# Patient Record
Sex: Female | Born: 1945 | Race: White | Hispanic: No | Marital: Married
Health system: Midwestern US, Academic
[De-identification: ages and names within clinical notes are randomized; demographics above are authoritative.]

---

## 2017-04-23 MED ORDER — FEXOFENADINE 180 MG PO TAB
180 mg | ORAL_TABLET | Freq: Every day | ORAL | 3 refills | 30.00000 days | Status: AC
Start: 2017-04-23 — End: ?

## 2017-04-23 MED ORDER — FLUTICASONE 50 MCG/ACTUATION NA SPSN
2 | Freq: Every day | NASAL | 3 refills | 60.00000 days | Status: AC
Start: 2017-04-23 — End: ?

## 2017-04-23 MED ORDER — SULFAMETHOXAZOLE-TRIMETHOPRIM 800-160 MG PO TAB
1 | ORAL_TABLET | Freq: Two times a day (BID) | ORAL | 0 refills | Status: CN
Start: 2017-04-23 — End: ?

## 2017-04-23 MED ORDER — LEVOFLOXACIN 250 MG PO TAB
250 mg | ORAL_TABLET | Freq: Every day | ORAL | 0 refills | Status: CN
Start: 2017-04-23 — End: ?

## 2017-04-23 MED ORDER — PREDNISONE 20 MG PO TAB
40 mg | ORAL_TABLET | Freq: Every day | ORAL | 0 refills | Status: CN
Start: 2017-04-23 — End: ?

## 2017-06-22 ENCOUNTER — Encounter: Admit: 2017-06-22 | Discharge: 2017-06-22 | Payer: BC Managed Care – PPO

## 2017-06-22 ENCOUNTER — Ambulatory Visit: Admit: 2017-06-22 | Discharge: 2017-06-22 | Payer: BC Managed Care – PPO

## 2017-06-22 DIAGNOSIS — J324 Chronic pansinusitis: Principal | ICD-10-CM

## 2017-06-22 DIAGNOSIS — K219 Gastro-esophageal reflux disease without esophagitis: ICD-10-CM

## 2017-06-22 DIAGNOSIS — J310 Chronic rhinitis: ICD-10-CM

## 2017-06-22 DIAGNOSIS — M545 Low back pain: Principal | ICD-10-CM

## 2017-06-22 DIAGNOSIS — R51 Headache: ICD-10-CM

## 2017-06-22 DIAGNOSIS — K529 Noninfective gastroenteritis and colitis, unspecified: ICD-10-CM

## 2017-06-22 DIAGNOSIS — M25552 Pain in left hip: ICD-10-CM

## 2017-06-22 DIAGNOSIS — M5126 Other intervertebral disc displacement, lumbar region: ICD-10-CM

## 2017-06-22 DIAGNOSIS — M1990 Unspecified osteoarthritis, unspecified site: ICD-10-CM

## 2017-06-22 DIAGNOSIS — T148XXA Other injury of unspecified body region, initial encounter: ICD-10-CM

## 2017-06-22 DIAGNOSIS — M7918 Myalgia, other site: ICD-10-CM

## 2017-06-22 NOTE — Progress Notes
chief complaint           HPI:  Tina Landry is a 71 y.o. female who returns to clinic for follow up for CRS.  She has a h/o MRSA and Pseudomonas sinusitis.      She is feeling well since the last visit. She did the nasal saline irrigations for 2 weeks after her last visit, stopped for a while, began getting symptoms again and restarted. Her symptoms were fluid buildup and discharge. Does the levaquin irrigation from time to time    The following portions of the patient's history were reviewed and updated as appropriate: allergies, current medications, past family history, past medical history, past social history, past surgical history and problem list.    ROS above reviewed      Current Outpatient Prescriptions:   ???  Aspirin 81 mg Tab, Take 81 mg by mouth every morning.  , Disp: , Rfl:   ???  fexofenadine(+) (ALLEGRA) 180 mg tablet, Take 1 tablet by mouth daily., Disp: 90 tablet, Rfl: 3  ???  fluticasone (FLONASE) 50 mcg/actuation nasal spray, Apply 2 sprays to each nostril as directed daily. Shake bottle gently before using., Disp: 48 g, Rfl: 3  ???  KRILL OIL PO, Take  by mouth., Disp: , Rfl:   ???  MULTIVITAMIN PO, Take 1 Tab by mouth daily., Disp: , Rfl:   ???  PROPRANOLOL HCL (INDERAL PO), Take 60 mg by mouth every morning., Disp: , Rfl:     General    Vitals:    06/22/17 1044   BP: 136/84   Pulse: 54     General:  Well-developed, well-nourished  Communication and Voice:  Clear pitch and clarity, age appropriate    Head and Face  Inspection:  Normocephalic and atraumatic without masses or lesions  Palpation:  Facial skeleton intact without bony stepoffs, no sinus tenderness  Salivary Glands:  No masses or tenderness  Facial Strength:  Facial motility symmetric and full bilaterally             Left -  House-Brackman Grade 1/6             Right - House-Brackman Grade 1/6    ENT  External nose:  No scar or anatomic deformity  Internal Nose:  Septum intact and midline.  No edema, polyps, or rhinorrhea Lips, Teeth, and gums:  Mucosa and teeth intact and viable  TMJ:  No pain to palpation with full mobility  Oral cavity/oropharynx:  No erythema or exudate with non-obstructive tonsils  Nasopharynx:  No masses or lesions with intact mucosa  Hypopharynx:  Intact mucosa without pooling of secretions  Larynx:  Full true vocal cord mobility without lesions or masses    Neck  Neck and Trachea:  Midline trachea without mass or lesion  Thyroid:  No mass or nodularity  Lymphatics:  No lymphadenopathy    Respiratory  Respiratory effort:  Equal inspiration & expiration without use of accessory muscles. No stridor    Cardiovascular  Peripheral Vascular:  Warm extremities with equal pulses    Eyes  Nystagmus: None  EOM: Equal extraocular motion bilaterally    Neuro/Psych/Balance  Orientation: Patient oriented to person, place, and time  Affect: Appropriate mood and affect  Gait: Intact with no imbalance  Cranial nerves: II-XII are intact    Ear  External canal: Left - Canal is patent with intact skin  Right - Canal is patent with intact skin  Tympanic Membranes: Left - Clear and mobile                                            Right - Clear and mobile  Middle Ears: Left - Aerated, no effusion, no masses                          Right - Aerated, no effusion, no masses      Office Procedure    Nasal Endoscopy    Indications: Was performed due to chronic rhinosinusitis    After topical anesthesia and decongestion had been obtained using aerosolized 1% lidocaine and oxymetazoline, a 30 degree rigid endoscope was placed into both nares with the patient in a sitting position. The following was observed:    Right Nasal Cavity and Paranasal Sinuses: Sinuses clear and patent.     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            Impression/Plan: Thank you for allowing me to see your patient in consultation.  Based on my exam and review of the patients records, the following is my impression and plan:    1. Chronic rhinosinusitis    2. Chronic rhinitis    She is improving well. She should continue to irrigate with her levaquin for another week then switch to just saline irrigations with baby shampoo.  We talked about expectations and she understands she will get the biofilm crusts from time to time.      F/u: 6 weeks    Heloise Purpura, M.D.    Professor and Villa Herb, M.D. Chairman  Department of Otolaryngology-Head and Neck Surgery  [place] [facility]       In the presence of Neldon Newport, MD,  I have taken down these notes, Cormac Prosser, Scribe. 06/22/2017 10:58 AM

## 2017-06-22 NOTE — Progress Notes
Date of Service: 06/22/2017    Subjective:             Tina Landry is a 71 y.o. female.    History of Present Illness       Review of Systems   Constitutional: Negative.    HENT: Positive for congestion, postnasal drip and rhinorrhea.    Eyes: Negative.    Respiratory: Negative.    Cardiovascular: Negative.    Gastrointestinal: Negative.    Endocrine: Negative.    Genitourinary: Negative.    Musculoskeletal: Negative.    Skin: Negative.    Allergic/Immunologic: Negative.    Neurological: Negative.    Hematological: Negative.    Psychiatric/Behavioral: Negative.          Objective:          Aspirin 81 mg Tab Take 81 mg by mouth every morning.      fexofenadine(+) (ALLEGRA) 180 mg tablet Take 1 tablet by mouth daily.    fluticasone (FLONASE) 50 mcg/actuation nasal spray Apply 2 sprays to each nostril as directed daily. Shake bottle gently before using.    KRILL OIL PO Take  by mouth.    MULTIVITAMIN PO Take 1 Tab by mouth daily.    PROPRANOLOL HCL (INDERAL PO) Take 60 mg by mouth every morning.     Vitals:    06/22/17 1044   BP: 136/84   Pulse: 54   Weight: 68 kg (150 lb)   Height: 167.6 cm (66")     Body mass index is 24.21 kg/m.     Physical Exam         Assessment and Plan:

## 2017-07-27 ENCOUNTER — Ambulatory Visit: Admit: 2017-07-27 | Discharge: 2017-07-27 | Payer: BC Managed Care – PPO

## 2017-07-27 DIAGNOSIS — J310 Chronic rhinitis: ICD-10-CM

## 2017-07-27 DIAGNOSIS — J324 Chronic pansinusitis: Principal | ICD-10-CM

## 2017-07-27 NOTE — Progress Notes
Date of Service: 07/27/2017    Subjective:             Tina Landry is a 71 y.o. female.    History of Present Illness  Tina Landry is a 71 y.o. female who returns to clinic for follow up for CRS.  She has a h/o MRSA and Pseudomonas sinusitis.      Overall she is doing ok, however every few days, she does have discolored drainage. Doing nasal saline rinses daily along with baby shampoo, completed the levaquin rinses 1 week after last being seen. After completing the levaquin, her drainage returned within a week.  No recent fevers or headaches.  ???  Still denies any sense of smell, has not returned, this is her baseline.     The following portions of the patient's history were reviewed and updated as appropriate: allergies, current medications, past family history, past medical history, past social history, past surgical history and problem list.  ???     Review of Systems   Constitutional: Negative.    HENT: Positive for rhinorrhea.    Eyes: Negative.    Respiratory: Negative.    Cardiovascular: Negative.    Gastrointestinal: Negative.    Endocrine: Negative.    Genitourinary: Negative.    Musculoskeletal: Negative.    Skin: Negative.    Allergic/Immunologic: Negative.    Neurological: Negative.    Hematological: Negative.    Psychiatric/Behavioral: Negative.          Objective:         ??? Aspirin 81 mg Tab Take 81 mg by mouth every morning.     ??? fexofenadine(+) (ALLEGRA) 180 mg tablet Take 1 tablet by mouth daily.   ??? fluticasone (FLONASE) 50 mcg/actuation nasal spray Apply 2 sprays to each nostril as directed daily. Shake bottle gently before using.   ??? KRILL OIL PO Take  by mouth.   ??? MULTIVITAMIN PO Take 1 Tab by mouth daily.   ??? PROPRANOLOL HCL (INDERAL PO) Take 60 mg by mouth every morning.     Vitals:    07/27/17 1032   BP: 143/84   Pulse: 51   Weight: 67.1 kg (148 lb)   Height: 167.6 cm (66)     Body mass index is 23.89 kg/m???.     Physical Exam  General:  Well-developed, well-nourished Communication and Voice:  Clear pitch and clarity, age appropriate  ???  Head and Face  Inspection:  Normocephalic and atraumatic without masses or lesions  Palpation:  Facial skeleton intact without bony stepoffs, no sinus tenderness  Salivary Glands:  No masses or tenderness  Facial Strength:  Facial motility symmetric and full bilaterally             Left -       House-Brackman Grade 1/6             Right -     House-Brackman Grade 1/6  ???  ENT  External nose:  No scar or anatomic deformity  Internal Nose:  Septum intact and midline.  No edema, polyps, or rhinorrhea  Lips, Teeth, and gums:  Mucosa and teeth intact and viable  TMJ:  No pain to palpation with full mobility  Oral cavity/oropharynx:  No erythema or exudate with non-obstructive tonsils  Nasopharynx:  No masses or lesions with intact mucosa  Hypopharynx:  Intact mucosa without pooling of secretions  Larynx:  Full true vocal cord mobility without lesions or masses  ???  Neck  Neck and Trachea:  Midline trachea without mass or lesion  Thyroid:  No mass or nodularity  Lymphatics:  No lymphadenopathy  ???  Respiratory  Respiratory effort:  Equal inspiration & expiration without use of accessory muscles. No stridor  ???  Cardiovascular  Peripheral Vascular:  Warm extremities with equal pulses  ???  Eyes  Nystagmus: None  EOM: Equal extraocular motion bilaterally  ???  Neuro/Psych/Balance  Orientation: Patient oriented to person, place, and time  Affect: Appropriate mood and affect  Gait: Intact with no imbalance  Cranial nerves: II-XII are intact  ???  Ear  External canal:   Left - Canal is patent with intact skin                             Right - Canal is patent with intact skin  Tympanic Membranes:   Left - Clear and mobile                                            Right - Clear and mobile  Middle Ears:      Left - Aerated, no effusion, no masses                          Right - Aerated, no effusion, no masses  ???  ???  Office Procedure  ???  Nasal Endoscopy  ??? Indications: Was performed due to chronic rhinosinusitis  ???  After topical anesthesia and decongestion had been obtained using aerosolized 1% lidocaine and oxymetazoline, a 30 degree rigid endoscope was placed into both nares with the patient in a sitting position. The following was observed:  ???  Right Nasal Cavity and Paranasal Sinuses: Sinuses clear and patent. Green crusting debrided from the ethmoid cavity.   ???  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  Septum: midline    Other:  ???  The patient tolerated the procedure well.       Assessment and Plan:  Thank you for allowing me to see your patient in consultation.  Based on my exam and review of the patients records, the following is my impression and plan:  ???  1. Chronic rhinosinusitis  ???  2. Chronic rhinitis  ???  She is at baseline today. She should continue to irrigate with her saline irrigations with baby shampoo.  We talked about expectations and she understands she will get the biofilm crusts from time to time. During worse symptomatic periods, she should start back up on her levaquin rinses for 1 week until symptoms improve and then back to saline rinses.   ???  ???  F/u: 6 weeks  ???

## 2017-09-13 ENCOUNTER — Encounter: Admit: 2017-09-13 | Discharge: 2017-09-13 | Payer: BC Managed Care – PPO

## 2017-09-13 NOTE — Telephone Encounter
Patient calling back again to f/u on her last call.

## 2017-09-13 NOTE — Telephone Encounter
Patient has a sinus infection and would like to speak to the nurse.

## 2017-09-13 NOTE — Telephone Encounter
lvm stating that I would address this with Dr Wyline Copas tomorrow

## 2017-09-14 MED ORDER — LEVOFLOXACIN 500 MG PO TAB
500 mg | ORAL_TABLET | Freq: Every day | ORAL | 0 refills | 7.00000 days | Status: AC
Start: 2017-09-14 — End: ?

## 2017-09-14 NOTE — Telephone Encounter
Patient had already been rinsing for one week with the levaquin. Per Dr Wyline Copas, levaquin oral abx. Advised patient to let us know if she is not seeing any improvement

## 2017-09-24 ENCOUNTER — Encounter: Admit: 2017-09-24 | Discharge: 2017-09-24 | Payer: BC Managed Care – PPO

## 2017-09-24 NOTE — Telephone Encounter
Patient calling to f/u on a prescription refill of the compounded medicine.

## 2017-09-24 NOTE — Telephone Encounter
Refill approved by Dr Wyline Copas and patient notified.

## 2017-10-14 ENCOUNTER — Encounter: Admit: 2017-10-14 | Discharge: 2017-10-14 | Payer: BC Managed Care – PPO

## 2017-10-14 NOTE — Telephone Encounter
Patient called to speak to the nurse regarding her increased sinus drainage and fever on and off.   She is still using the Levaquin.

## 2017-10-15 ENCOUNTER — Ambulatory Visit: Admit: 2017-10-15 | Discharge: 2017-10-15 | Payer: BC Managed Care – PPO

## 2017-10-15 ENCOUNTER — Encounter: Admit: 2017-10-15 | Discharge: 2017-10-15 | Payer: BC Managed Care – PPO

## 2017-10-15 DIAGNOSIS — R51 Headache: ICD-10-CM

## 2017-10-15 DIAGNOSIS — K529 Noninfective gastroenteritis and colitis, unspecified: ICD-10-CM

## 2017-10-15 DIAGNOSIS — M25552 Pain in left hip: ICD-10-CM

## 2017-10-15 DIAGNOSIS — J310 Chronic rhinitis: ICD-10-CM

## 2017-10-15 DIAGNOSIS — M7918 Myalgia, other site: ICD-10-CM

## 2017-10-15 DIAGNOSIS — M5126 Other intervertebral disc displacement, lumbar region: ICD-10-CM

## 2017-10-15 DIAGNOSIS — K219 Gastro-esophageal reflux disease without esophagitis: ICD-10-CM

## 2017-10-15 DIAGNOSIS — M545 Low back pain: Principal | ICD-10-CM

## 2017-10-15 DIAGNOSIS — T148XXA Other injury of unspecified body region, initial encounter: ICD-10-CM

## 2017-10-15 DIAGNOSIS — M1990 Unspecified osteoarthritis, unspecified site: ICD-10-CM

## 2017-10-15 DIAGNOSIS — J324 Chronic pansinusitis: Principal | ICD-10-CM

## 2017-10-15 MED ORDER — OSELTAMIVIR 75 MG PO CAP
75 mg | ORAL_CAPSULE | Freq: Two times a day (BID) | ORAL | 0 refills | Status: AC
Start: 2017-10-15 — End: ?

## 2017-10-15 NOTE — Progress Notes
chief complaint           HPI:  Tina Landry is a 71 y.o. female who who returns to clinic for follow up for CRS.  She has a h/o MRSA and Pseudomonas sinusitis.      Still not feeling good at all. Yesterday was very bad. She has been having flare up and reports a feeling of her head not being in the right place. She continues to have thick, colored PND, facial/sinus pressure, fever, chills, and sweats. She denies congestion, is able to breath through her nose.    Continues with her Levaquin irrigations.  She is hesitant to continue with the levaquin but will if it will help.     The following portions of the patient's history were reviewed and updated as appropriate: allergies, current medications, past family history, past medical history, past social history, past surgical history and problem list.    ROS above reviewed      Current Outpatient Prescriptions:   ???  Aspirin 81 mg Tab, Take 81 mg by mouth every morning.  , Disp: , Rfl:   ???  fexofenadine(+) (ALLEGRA) 180 mg tablet, Take 1 tablet by mouth daily., Disp: 90 tablet, Rfl: 3  ???  fluticasone (FLONASE) 50 mcg/actuation nasal spray, Apply 2 sprays to each nostril as directed daily. Shake bottle gently before using., Disp: 48 g, Rfl: 3  ???  KRILL OIL PO, Take  by mouth., Disp: , Rfl:   ???  MULTIVITAMIN PO, Take 1 Tab by mouth daily., Disp: , Rfl:   ???  PROPRANOLOL HCL (INDERAL PO), Take 60 mg by mouth every morning., Disp: , Rfl:     General    Vitals:    10/15/17 1058   BP: 121/75   Pulse: 69     General:  Well-developed, well-nourished  Communication and Voice:  Clear pitch and clarity, age appropriate    Head and Face  Inspection:  Normocephalic and atraumatic without masses or lesions  Palpation:  Facial skeleton intact without bony stepoffs, no sinus tenderness  Salivary Glands:  No masses or tenderness  Facial Strength:  Facial motility symmetric and full bilaterally             Left -  House-Brackman Grade 1/6             Right - House-Brackman Grade 1/6    ENT External nose:  No scar or anatomic deformity  Internal Nose:  Septum intact and midline.  No edema, polyps, or rhinorrhea  Lips, Teeth, and gums:  Mucosa and teeth intact and viable  TMJ:  No pain to palpation with full mobility  Oral cavity/oropharynx:  No erythema or exudate with non-obstructive tonsils  Nasopharynx:  No masses or lesions with intact mucosa  Hypopharynx:  Intact mucosa without pooling of secretions  Larynx:  Full true vocal cord mobility without lesions or masses    Neck  Neck and Trachea:  Midline trachea without mass or lesion  Thyroid:  No mass or nodularity  Lymphatics:  No lymphadenopathy    Respiratory  Respiratory effort:  Equal inspiration & expiration without use of accessory muscles. No stridor    Cardiovascular  Peripheral Vascular:  Warm extremities with equal pulses    Eyes  Nystagmus: None  EOM: Equal extraocular motion bilaterally    Neuro/Psych/Balance  Orientation: Patient oriented to person, place, and time  Affect: Appropriate mood and affect  Gait: Intact with no imbalance  Cranial nerves: II-XII are intact  Ear  External canal: Left - Canal is patent with intact skin                             Right - Canal is patent with intact skin  Tympanic Membranes: Left - Clear and mobile                                            Right - Clear and mobile  Middle Ears: Left - Aerated, no effusion, no masses                          Right - Aerated, no effusion, no masses      Office Procedure    Nasal Endoscopy    Indications: Was performed due to chronic rhinosinusitis    After topical anesthesia and decongestion had been obtained using aerosolized 1% lidocaine and oxymetazoline, a 30 degree rigid endoscope was placed into both nares with the patient in a sitting position. The following was observed:    Right Nasal Cavity and Paranasal Sinuses: Mild purulent crusting on the right around the maxillary antrostomy opening. No polyps or masses. All sinuses open Left Nasal Cavity and Paranasal Sinuses: Mild thick mucopurulence on the left. No polyps or masses. All sinuses open.     Septum:  Other:    The patient tolerated the procedure well.            Impression/Plan:    Thank you for allowing me to see your patient in consultation.  Based on my exam and review of the patients records, the following is my impression and plan:    1. Chronic rhinosinusitis    2. Chronic rhintis    On exam today, she doesn't look acutely infected from a bacterial standpoint. I currently think she has a viral infection, could feasibly be the flu. Will send her in a prescription of Tamiflu. Instructed her to keep hydrated and rest plenty.     She is ok to hold off on the Levaquin irrigations and continue with regular nasal saline irrigations.       F/u: RTC 2-3 months    Heloise Purpura, M.D.    Professor and Villa Herb, M.D. Chairman  Department of Otolaryngology-Head and Neck Surgery  [place] [facility]       In the presence of Neldon Newport, MD,  I have taken down these notes, Cormac Prosser, Scribe. 10/15/2017 11:13 AM

## 2017-10-15 NOTE — Progress Notes
Date of Service: 10/15/2017    Subjective:             Tina Landry is a 71 y.o. female.    History of Present Illness     Review of Systems   Constitutional: Positive for fatigue and fever.   HENT: Positive for postnasal drip and sinus pressure.    Eyes: Negative.    Respiratory: Negative.    Cardiovascular: Negative.    Gastrointestinal: Negative.    Endocrine: Negative.    Genitourinary: Negative.    Musculoskeletal: Negative.    Skin: Negative.    Allergic/Immunologic: Negative.    Neurological: Positive for dizziness, light-headedness and headaches.   Hematological: Negative.    Psychiatric/Behavioral: Negative.          Objective:          Aspirin 81 mg Tab Take 81 mg by mouth every morning.      fexofenadine(+) (ALLEGRA) 180 mg tablet Take 1 tablet by mouth daily.    fluticasone (FLONASE) 50 mcg/actuation nasal spray Apply 2 sprays to each nostril as directed daily. Shake bottle gently before using.    KRILL OIL PO Take  by mouth.    MULTIVITAMIN PO Take 1 Tab by mouth daily.    PROPRANOLOL HCL (INDERAL PO) Take 60 mg by mouth every morning.     Vitals:    10/15/17 1058   BP: 121/75   Pulse: 69   Weight: 67.6 kg (149 lb)   Height: 167.6 cm (66")     Body mass index is 24.05 kg/m.     Physical Exam         Assessment and Plan:

## 2017-10-15 NOTE — Telephone Encounter
Patient coming in today

## 2017-11-22 ENCOUNTER — Encounter: Admit: 2017-11-22 | Discharge: 2017-11-22 | Payer: BC Managed Care – PPO

## 2017-11-22 MED ORDER — LEVOFLOXACIN 500 MG PO TAB
500 mg | ORAL_TABLET | Freq: Every day | ORAL | 0 refills | 7.00000 days | Status: AC
Start: 2017-11-22 — End: ?

## 2017-11-22 NOTE — Telephone Encounter
Pt calling regarding her sinus having some issues

## 2017-11-22 NOTE — Telephone Encounter
Green yellow drainage, pressure, sore throat. Started levaquin irrigations week ago when discolored drainage started. She is feeling worse. Let me know what you would like to do.

## 2017-11-22 NOTE — Telephone Encounter
Patient notified and rx sent

## 2017-12-03 ENCOUNTER — Encounter: Admit: 2017-12-03 | Discharge: 2017-12-03 | Payer: BC Managed Care – PPO

## 2017-12-03 ENCOUNTER — Ambulatory Visit: Admit: 2017-12-03 | Discharge: 2017-12-04 | Payer: BC Managed Care – PPO

## 2017-12-03 DIAGNOSIS — J310 Chronic rhinitis: ICD-10-CM

## 2017-12-03 DIAGNOSIS — J324 Chronic pansinusitis: Principal | ICD-10-CM

## 2017-12-03 NOTE — Progress Notes
chief complaint           HPI:  Tina Landry is a 71 y.o. female who returns to clinic in follow up for current sinus infection. LCV 10/15/17.     Got sick right after Thanksgiving. Is still reportedly sick. Getting thick discolored drainage still. Fatigued. Called [place] and was prescribed Levaquin. Has 1 day left of it. Has not been doing Levaquin in her nasal saline irrigations at our request. Was on Levaquin irrigations when this infection started. Is not feeling significantly better while on the current antibiotics.       The following portions of the patient's history were reviewed and updated as appropriate: allergies, current medications, past family history, past medical history, past social history, past surgical history and problem list.    ROS above reviewed      Current Outpatient Medications:   ???  Aspirin 81 mg Tab, Take 81 mg by mouth every morning.  , Disp: , Rfl:   ???  fexofenadine(+) (ALLEGRA) 180 mg tablet, Take 1 tablet by mouth daily., Disp: 90 tablet, Rfl: 3  ???  fluticasone (FLONASE) 50 mcg/actuation nasal spray, Apply 2 sprays to each nostril as directed daily. Shake bottle gently before using., Disp: 48 g, Rfl: 3  ???  KRILL OIL PO, Take  by mouth., Disp: , Rfl:   ???  levoFLOXacin (LEVAQUIN) 500 mg tablet, Take one tablet by mouth daily for 14 days., Disp: 14 tablet, Rfl: 0  ???  MULTIVITAMIN PO, Take 1 Tab by mouth daily., Disp: , Rfl:   ???  PROPRANOLOL HCL (INDERAL PO), Take 60 mg by mouth every morning., Disp: , Rfl:     General    Vitals:    12/03/17 0934   BP: 137/88   Pulse: 62     General:  Well-developed, well-nourished  Communication and Voice:  Clear pitch and clarity, age appropriate    Head and Face  Inspection:  Normocephalic and atraumatic without masses or lesions  Palpation:  Facial skeleton intact without bony stepoffs, no sinus tenderness  Salivary Glands:  No masses or tenderness  Facial Strength:  Facial motility symmetric and full bilaterally             Left -  House-Brackman Grade 1/6 Right - House-Brackman Grade 1/6    ENT  External nose:  No scar or anatomic deformity  Internal Nose:  Septum intact and midline.  No edema, polyps, or rhinorrhea  Lips, Teeth, and gums:  Mucosa and teeth intact and viable  TMJ:  No pain to palpation with full mobility  Oral cavity/oropharynx:  No erythema or exudate with non-obstructive tonsils  Nasopharynx:  No masses or lesions with intact mucosa  Hypopharynx:  Intact mucosa without pooling of secretions  Larynx:  Full true vocal cord mobility without lesions or masses    Neck  Neck and Trachea:  Midline trachea without mass or lesion  Thyroid:  No mass or nodularity  Lymphatics:  No lymphadenopathy    Respiratory  Respiratory effort:  Equal inspiration & expiration without use of accessory muscles. No stridor    Cardiovascular  Peripheral Vascular:  Warm extremities with equal pulses    Eyes  Nystagmus: None  EOM: Equal extraocular motion bilaterally    Neuro/Psych/Balance  Orientation: Patient oriented to person, place, and time  Affect: Appropriate mood and affect  Gait: Intact with no imbalance  Cranial nerves: II-XII are intact    Ear  External canal: Left - Canal is patent with intact  skin                             Right - Canal is patent with intact skin  Tympanic Membranes: Left - Clear and mobile                                            Right - Clear and mobile  Middle Ears: Left - Aerated, no effusion, no masses                          Right - Aerated, no effusion, no masses      Office Procedure    Nasal Endoscopy    Indications: Was performed due to chronic rhinosinusitis    After topical anesthesia and decongestion had been obtained using aerosolized 1% lidocaine and oxymetazoline, a 30 degree rigid endoscope was placed into both nares with the patient in a sitting position. The following was observed:    Right Nasal Cavity and Paranasal Sinuses: Thick purulent drainage globally. No polyps or masses. Left Nasal Cavity and Paranasal Sinuses: Thick purulent drainage globally. No polyps or masses.     Septum:  Other:    The patient tolerated the procedure well.            Impression/Plan:    Thank you for allowing me to see your patient in consultation.  Based on my exam and review of the patients records, the following is my impression and plan:    1.Chronic rhinosinusitis    2. Chronic rhintis    She continues to be acutely infected. She is not feeling much better with the Levaquin.  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  We took a culture and  I would recommend stop using the oral Levaquin. She should continue with the nasal saline irrigations BID.     Took Culture from left middle meatus today and will call with results. Will wait to prescribe an antibiotic until we get results.       F/u:     Heloise Purpura, M.D.    Professor and Villa Herb, M.D. Chairman  Department of Otolaryngology-Head and Neck Surgery  [place] [facility]       In the presence of Neldon Newport, MD,  I have taken down these notes, Cormac Prosser, Scribe. 12/03/2017 9:50 AM

## 2017-12-03 NOTE — Progress Notes
Date of Service: 12/03/2017    Subjective:             Tina Landry is a 71 y.o. female.    History of Present Illness       Review of Systems   Constitutional: Positive for fatigue.   HENT: Positive for postnasal drip and rhinorrhea.    Eyes: Negative.    Respiratory: Negative.    Cardiovascular: Negative.    Gastrointestinal: Negative.    Endocrine: Negative.    Genitourinary: Negative.    Musculoskeletal: Negative.    Skin: Negative.    Allergic/Immunologic: Negative.    Neurological: Negative.    Hematological: Negative.    Psychiatric/Behavioral: Positive for sleep disturbance.   All other systems reviewed and are negative.        Objective:          Aspirin 81 mg Tab Take 81 mg by mouth every morning.      fexofenadine(+) (ALLEGRA) 180 mg tablet Take 1 tablet by mouth daily.    fluticasone (FLONASE) 50 mcg/actuation nasal spray Apply 2 sprays to each nostril as directed daily. Shake bottle gently before using.    KRILL OIL PO Take  by mouth.    levoFLOXacin (LEVAQUIN) 500 mg tablet Take one tablet by mouth daily for 14 days.    MULTIVITAMIN PO Take 1 Tab by mouth daily.    PROPRANOLOL HCL (INDERAL PO) Take 60 mg by mouth every morning.     Vitals:    12/03/17 0934   BP: 137/88   Pulse: 62   Weight: 67.6 kg (149 lb)   Height: 167.6 cm (66")     Body mass index is 24.05 kg/m.     Physical Exam         Assessment and Plan:

## 2017-12-05 LAB — CULTURE-WOUND/TISSUE/FLUID(AEROBIC ONLY)W/SENSITIVITY

## 2017-12-06 ENCOUNTER — Encounter: Admit: 2017-12-06 | Discharge: 2017-12-06 | Payer: BC Managed Care – PPO

## 2017-12-06 MED ORDER — DOXYCYCLINE HYCLATE 100 MG PO CAP
100 mg | ORAL_CAPSULE | Freq: Two times a day (BID) | ORAL | 1 refills | 8.00000 days | Status: AC
Start: 2017-12-06 — End: ?

## 2017-12-06 NOTE — Telephone Encounter
Patient notified and rx sent

## 2017-12-06 NOTE — Telephone Encounter
-----   Message from Waynette Buttery, MD sent at 12/06/2017 10:02 AM CST -----  Please call her     Good news - it is not psuedomonas    Bad news - MRSA    Have her stop the levaquin irrigatoins and just do plain saline.  Doxy for 3 weeks with 1 refill.  RTC in 3 weeks

## 2017-12-08 LAB — CULTURE-ANAEROBIC

## 2018-01-04 ENCOUNTER — Ambulatory Visit: Admit: 2018-01-04 | Discharge: 2018-01-04 | Payer: BC Managed Care – PPO

## 2018-01-04 DIAGNOSIS — J324 Chronic pansinusitis: Principal | ICD-10-CM

## 2018-01-04 DIAGNOSIS — J310 Chronic rhinitis: ICD-10-CM

## 2018-03-14 ENCOUNTER — Encounter: Admit: 2018-03-14 | Discharge: 2018-03-14 | Payer: BC Managed Care – PPO

## 2018-03-15 MED ORDER — DOXYCYCLINE HYCLATE 100 MG PO CAP
100 mg | ORAL_CAPSULE | Freq: Two times a day (BID) | ORAL | 0 refills | 8.00000 days | Status: AC
Start: 2018-03-15 — End: ?

## 2018-03-31 ENCOUNTER — Encounter: Admit: 2018-03-31 | Discharge: 2018-03-31 | Payer: BC Managed Care – PPO

## 2018-03-31 DIAGNOSIS — M25552 Pain in left hip: ICD-10-CM

## 2018-03-31 DIAGNOSIS — M1990 Unspecified osteoarthritis, unspecified site: ICD-10-CM

## 2018-03-31 DIAGNOSIS — M7918 Myalgia, other site: ICD-10-CM

## 2018-03-31 DIAGNOSIS — T148XXA Other injury of unspecified body region, initial encounter: ICD-10-CM

## 2018-03-31 DIAGNOSIS — M545 Low back pain: Principal | ICD-10-CM

## 2018-03-31 DIAGNOSIS — K219 Gastro-esophageal reflux disease without esophagitis: ICD-10-CM

## 2018-03-31 DIAGNOSIS — R51 Headache: ICD-10-CM

## 2018-03-31 DIAGNOSIS — M5126 Other intervertebral disc displacement, lumbar region: ICD-10-CM

## 2018-03-31 DIAGNOSIS — K529 Noninfective gastroenteritis and colitis, unspecified: ICD-10-CM

## 2018-04-06 ENCOUNTER — Encounter: Admit: 2018-04-06 | Discharge: 2018-04-06 | Payer: BC Managed Care – PPO

## 2018-05-03 ENCOUNTER — Encounter: Admit: 2018-05-03 | Discharge: 2018-05-03 | Payer: BC Managed Care – PPO

## 2018-05-03 ENCOUNTER — Ambulatory Visit: Admit: 2018-05-03 | Discharge: 2018-05-03 | Payer: BC Managed Care – PPO

## 2018-05-03 DIAGNOSIS — J310 Chronic rhinitis: ICD-10-CM

## 2018-05-03 DIAGNOSIS — J324 Chronic pansinusitis: Principal | ICD-10-CM

## 2018-05-03 MED ORDER — BUDESONIDE 0.5 MG/2 ML IN NBSP
Freq: Two times a day (BID) | 3 refills | Status: AC
Start: 2018-05-03 — End: ?

## 2018-05-06 LAB — CULTURE-WOUND/TISSUE/FLUID(AEROBIC ONLY)W/SENSITIVITY

## 2018-05-09 ENCOUNTER — Encounter: Admit: 2018-05-09 | Discharge: 2018-05-09 | Payer: BC Managed Care – PPO

## 2018-05-09 LAB — CULTURE-ANAEROBIC

## 2018-05-09 MED ORDER — MUPIROCIN 2 % TP OINT
Freq: Two times a day (BID) | TOPICAL | 3 refills | 11.00000 days | Status: AC
Start: 2018-05-09 — End: 2018-10-26

## 2018-05-09 MED ORDER — SULFAMETHOXAZOLE-TRIMETHOPRIM 800-160 MG PO TAB
1 | ORAL_TABLET | Freq: Two times a day (BID) | ORAL | 0 refills | Status: AC
Start: 2018-05-09 — End: ?

## 2018-06-07 ENCOUNTER — Ambulatory Visit: Admit: 2018-06-07 | Discharge: 2018-06-07 | Payer: BC Managed Care – PPO

## 2018-06-07 DIAGNOSIS — J324 Chronic pansinusitis: Principal | ICD-10-CM

## 2018-06-07 DIAGNOSIS — J310 Chronic rhinitis: ICD-10-CM

## 2018-06-07 MED ORDER — SULFAMETHOXAZOLE-TRIMETHOPRIM 800-160 MG PO TAB
1 | ORAL_TABLET | Freq: Two times a day (BID) | ORAL | 0 refills | Status: AC
Start: 2018-06-07 — End: ?

## 2018-07-11 ENCOUNTER — Encounter: Admit: 2018-07-11 | Discharge: 2018-07-11 | Payer: BC Managed Care – PPO

## 2018-07-12 MED ORDER — SULFAMETHOXAZOLE-TRIMETHOPRIM 800-160 MG PO TAB
1 | ORAL_TABLET | Freq: Two times a day (BID) | ORAL | 0 refills | Status: AC
Start: 2018-07-12 — End: ?

## 2018-08-16 ENCOUNTER — Ambulatory Visit: Admit: 2018-08-16 | Discharge: 2018-08-17 | Payer: BC Managed Care – PPO

## 2018-08-16 DIAGNOSIS — J324 Chronic pansinusitis: ICD-10-CM

## 2018-08-16 DIAGNOSIS — J310 Chronic rhinitis: Principal | ICD-10-CM

## 2018-09-28 ENCOUNTER — Encounter: Admit: 2018-09-28 | Discharge: 2018-09-29 | Payer: BC Managed Care – PPO

## 2018-10-12 ENCOUNTER — Encounter: Admit: 2018-10-12 | Discharge: 2018-10-12 | Payer: BC Managed Care – PPO

## 2018-10-26 ENCOUNTER — Encounter: Admit: 2018-10-26 | Discharge: 2018-10-26 | Payer: BC Managed Care – PPO

## 2018-10-26 MED ORDER — MUPIROCIN 2 % TP OINT
Freq: Two times a day (BID) | TOPICAL | 3 refills | 11.00000 days | Status: AC
Start: 2018-10-26 — End: ?

## 2018-12-06 ENCOUNTER — Ambulatory Visit: Admit: 2018-12-06 | Discharge: 2018-12-07 | Payer: BC Managed Care – PPO

## 2018-12-06 ENCOUNTER — Encounter: Admit: 2018-12-06 | Discharge: 2018-12-06 | Payer: BC Managed Care – PPO

## 2018-12-06 DIAGNOSIS — M7918 Myalgia, other site: ICD-10-CM

## 2018-12-06 DIAGNOSIS — J310 Chronic rhinitis: Principal | ICD-10-CM

## 2018-12-06 DIAGNOSIS — M1990 Unspecified osteoarthritis, unspecified site: ICD-10-CM

## 2018-12-06 DIAGNOSIS — J324 Chronic pansinusitis: ICD-10-CM

## 2018-12-06 DIAGNOSIS — K529 Noninfective gastroenteritis and colitis, unspecified: ICD-10-CM

## 2018-12-06 DIAGNOSIS — M25552 Pain in left hip: ICD-10-CM

## 2018-12-06 DIAGNOSIS — K219 Gastro-esophageal reflux disease without esophagitis: ICD-10-CM

## 2018-12-06 DIAGNOSIS — R51 Headache: ICD-10-CM

## 2018-12-06 DIAGNOSIS — T148XXA Other injury of unspecified body region, initial encounter: ICD-10-CM

## 2018-12-06 DIAGNOSIS — M545 Low back pain: Principal | ICD-10-CM

## 2018-12-06 DIAGNOSIS — M5126 Other intervertebral disc displacement, lumbar region: ICD-10-CM

## 2018-12-06 MED ORDER — MUPIROCIN 2 % TP OINT
TOPICAL | 3 refills | 11.00000 days | Status: AC
Start: 2018-12-06 — End: ?

## 2019-01-11 ENCOUNTER — Encounter: Admit: 2019-01-11 | Discharge: 2019-01-11 | Payer: BC Managed Care – PPO

## 2019-01-17 ENCOUNTER — Ambulatory Visit: Admit: 2019-01-17 | Discharge: 2019-01-18 | Payer: BC Managed Care – PPO

## 2019-01-17 DIAGNOSIS — J324 Chronic pansinusitis: Secondary | ICD-10-CM

## 2019-01-17 DIAGNOSIS — J310 Chronic rhinitis: Secondary | ICD-10-CM

## 2019-01-23 ENCOUNTER — Encounter: Admit: 2019-01-23 | Discharge: 2019-01-23 | Payer: BC Managed Care – PPO

## 2019-01-23 MED ORDER — CEFDINIR 300 MG PO CAP
300 mg | ORAL_CAPSULE | Freq: Two times a day (BID) | ORAL | 0 refills | 8.00000 days | Status: AC
Start: 2019-01-23 — End: ?

## 2019-07-04 ENCOUNTER — Encounter: Admit: 2019-07-04 | Discharge: 2019-07-04

## 2019-07-04 DIAGNOSIS — J324 Chronic pansinusitis: Secondary | ICD-10-CM

## 2019-07-04 MED ORDER — CEFDINIR 300 MG PO CAP
300 mg | ORAL_CAPSULE | Freq: Two times a day (BID) | ORAL | 0 refills | 8.00000 days | Status: DC
Start: 2019-07-04 — End: 2019-07-18

## 2019-07-04 NOTE — Telephone Encounter
Pt calling in regards to colored sinus drainage, and requesting to order a medication for sinus issues.  Wanted to speak to nurse

## 2019-07-04 NOTE — Telephone Encounter
Pt stated that about a week ago, she began experiencing pressure under the right side of her eye. She wakes up with a headaches. Reports thick yellow drainage down the back of her throat. Has been doing mupirocin irrigations BID. Per Dr. Wyline Copas, cefdinir BID for 2 weeks sent to preferred pharmacy. She will call back if her symptoms do not improve.

## 2019-07-18 ENCOUNTER — Encounter: Admit: 2019-07-18 | Discharge: 2019-07-18

## 2019-07-18 DIAGNOSIS — J324 Chronic pansinusitis: Secondary | ICD-10-CM

## 2019-07-18 MED ORDER — CEFDINIR 300 MG PO CAP
300 mg | ORAL_CAPSULE | Freq: Two times a day (BID) | ORAL | 0 refills | 8.00000 days | Status: AC
Start: 2019-07-18 — End: ?

## 2019-07-18 NOTE — Telephone Encounter
Patient called to speak to nurse regarding her sinus.

## 2019-07-18 NOTE — Telephone Encounter
Returned patient call to get more information.  She has finished her 2 weeks of Omnicef and is not improved.      Per Dr. Wyline Copas, 7 more days of [facility] and return appointment.

## 2019-07-25 ENCOUNTER — Encounter: Admit: 2019-07-25 | Discharge: 2019-07-25

## 2019-07-25 ENCOUNTER — Ambulatory Visit: Admit: 2019-07-25 | Discharge: 2019-07-26

## 2019-07-25 DIAGNOSIS — M25552 Pain in left hip: Secondary | ICD-10-CM

## 2019-07-25 DIAGNOSIS — J310 Chronic rhinitis: Secondary | ICD-10-CM

## 2019-07-25 DIAGNOSIS — J324 Chronic pansinusitis: Secondary | ICD-10-CM

## 2019-07-25 DIAGNOSIS — T148XXA Other injury of unspecified body region, initial encounter: Secondary | ICD-10-CM

## 2019-07-25 DIAGNOSIS — R51 Headache: Secondary | ICD-10-CM

## 2019-07-25 DIAGNOSIS — M545 Low back pain: Secondary | ICD-10-CM

## 2019-07-25 DIAGNOSIS — M1990 Unspecified osteoarthritis, unspecified site: Secondary | ICD-10-CM

## 2019-07-25 DIAGNOSIS — K219 Gastro-esophageal reflux disease without esophagitis: Secondary | ICD-10-CM

## 2019-07-25 DIAGNOSIS — K529 Noninfective gastroenteritis and colitis, unspecified: Secondary | ICD-10-CM

## 2019-07-25 DIAGNOSIS — M7918 Myalgia, other site: Secondary | ICD-10-CM

## 2019-07-25 DIAGNOSIS — M5126 Other intervertebral disc displacement, lumbar region: Secondary | ICD-10-CM

## 2019-07-25 NOTE — Progress Notes
Date of Service: 07/25/2019    Subjective:             Tina Landry is a 73 y.o. female.    History of Present Illness       Review of Systems   Constitutional: Negative.    HENT: Negative.    Eyes: Negative.    Respiratory: Negative.    Cardiovascular: Negative.    Gastrointestinal: Negative.    Endocrine: Negative.    Genitourinary: Negative.    Musculoskeletal: Negative.    Skin: Negative.    Allergic/Immunologic: Negative.    Neurological: Negative.    Hematological: Negative.    Psychiatric/Behavioral: Negative.          Objective:          Aspirin 81 mg Tab Take 81 mg by mouth every morning.      budesonide respule (PULMICORT) 0.5 mg/2 mL nebulizer solution Drip method: drop 1/2 ampule into each nostril BID in migand position    cefdinir (OMNICEF) 300 mg capsule Take one capsule by mouth every 12 hours for 7 days.    fexofenadine(+) (ALLEGRA) 180 mg tablet Take 1 tablet by mouth daily.    fluticasone (FLONASE) 50 mcg/actuation nasal spray Apply 2 sprays to each nostril as directed daily. Shake bottle gently before using.    KRILL OIL PO Take  by mouth.    MULTIVITAMIN PO Take 1 Tab by mouth daily.    mupirocin (BACTROBAN) 2 % topical ointment Add nickel size amount to rinse bottle and rinse through both nostrils BID    mupirocin (BACTROBAN) 2 % topical ointment Add nickel size amount to the rinse bottle BID and rinse through both nostrils    PROPRANOLOL HCL (INDERAL PO) Take 60 mg by mouth every morning.     Vitals:    07/25/19 1423   BP: 117/74   BP Source: Arm, Left Upper   Patient Position: Sitting   Pulse: 61   Temp: 36.6 C (97.9 F)   Weight: 68 kg (150 lb)   Height: 167.6 cm (66")   PainSc: Zero     Body mass index is 24.21 kg/m.     Physical Exam         Assessment and Plan:

## 2019-07-25 NOTE — Progress Notes
chief complaint           HPI:  Tina Landry is a 73 y.o. female who I was asked to see in consultation for evaluation of chronic sinusitis.  LCV 01/17/2019, at which time she was having biofilm crusting in left maxillary, and was on mupirocin irrigations BID.     Since last visit she has undergone microgen which showed streptococcus intermedius and staphylococcus epidermis and was recommended to take cefdinir and levaquin irrigations.    She did really well since last visit until about one month ago. She is now struggling with right nasal obstruction at night. She has intermittently taken neosynephrine for this which improves her symptoms. She also complains of thick yellow drainage that she is coughing up, but only in the morning. She is doing sinus rinses in the AM. Reports that she just completed a month of omnicef which didn't have much improvement in her symptoms.      The following portions of the patient's history were reviewed and updated as appropriate: allergies, current medications, past family history, past medical history, past social history, past surgical history and problem list.    ROS above reviewed      Current Outpatient Medications:   ???  Aspirin 81 mg Tab, Take 81 mg by mouth every morning.  , Disp: , Rfl:   ???  budesonide respule (PULMICORT) 0.5 mg/2 mL nebulizer solution, Drip method: drop 1/2 ampule into each nostril BID in migand position, Disp: 120 mL, Rfl: 3  ???  cefdinir (OMNICEF) 300 mg capsule, Take one capsule by mouth every 12 hours for 7 days., Disp: 14 capsule, Rfl: 0  ???  fexofenadine(+) (ALLEGRA) 180 mg tablet, Take 1 tablet by mouth daily., Disp: 90 tablet, Rfl: 3  ???  fluticasone (FLONASE) 50 mcg/actuation nasal spray, Apply 2 sprays to each nostril as directed daily. Shake bottle gently before using., Disp: 48 g, Rfl: 3  ???  KRILL OIL PO, Take  by mouth., Disp: , Rfl:   ???  MULTIVITAMIN PO, Take 1 Tab by mouth daily., Disp: , Rfl: ???  mupirocin (BACTROBAN) 2 % topical ointment, Add nickel size amount to rinse bottle and rinse through both nostrils BID, Disp: 30 g, Rfl: 3  ???  mupirocin (BACTROBAN) 2 % topical ointment, Add nickel size amount to the rinse bottle BID and rinse through both nostrils, Disp: 30 g, Rfl: 3  ???  PROPRANOLOL HCL (INDERAL PO), Take 60 mg by mouth every morning., Disp: , Rfl:     General    Vitals:    07/25/19 1423   BP: 117/74   Pulse: 61   Temp: 36.6 ???C (97.9 ???F)     General:  Well-developed, well-nourished  Communication and Voice:  Clear pitch and clarity, age appropriate    Head and Face  Inspection:  Normocephalic and atraumatic without masses or lesions  Palpation:  Facial skeleton intact without bony stepoffs, no sinus tenderness  Salivary Glands:  No masses or tenderness  Facial Strength:  Facial motility symmetric and full bilaterally             Left -  House-Brackman Grade 1/6             Right - House-Brackman Grade 1/6    ENT  External nose:  No scar or anatomic deformity  Internal Nose:  Septum intact and midline.  No edema, polyps, or rhinorrhea  Lips, Teeth, and gums:  Mucosa and teeth intact and viable  TMJ:  No pain  to palpation with full mobility  Oral cavity/oropharynx:  No erythema or exudate with non-obstructive tonsils  Nasopharynx:  No masses or lesions with intact mucosa  Hypopharynx:  Intact mucosa without pooling of secretions  Larynx:  Full true vocal cord mobility without lesions or masses    Neck  Neck and Trachea:  Midline trachea without mass or lesion  Thyroid:  No mass or nodularity  Lymphatics:  No lymphadenopathy    Respiratory  Respiratory effort:  Equal inspiration & expiration without use of accessory muscles. No stridor    Cardiovascular  Peripheral Vascular:  Warm extremities with equal pulses    Eyes  Nystagmus: None  EOM: Equal extraocular motion bilaterally    Neuro/Psych/Balance  Orientation: Patient oriented to person, place, and time  Affect: Appropriate mood and affect Gait: Intact with no imbalance  Cranial nerves: II-XII are intact    Ear  External canal: Left - Canal is patent with intact skin                             Right - Canal is patent with intact skin  Tympanic Membranes: Left - Clear and mobile                                            Right - Clear and mobile  Middle Ears: Left - Aerated, no effusion, no masses                          Right - Aerated, no effusion, no masses      Office Procedure    Nasal Endoscopy    Indications: Was performed due to chronic rhinosinusitis    After topical anesthesia and decongestion had been obtained using aerosolized 1% lidocaine and oxymetazoline, a 30 degree rigid endoscope was placed into both nares with the patient in a sitting position. The following was observed:    Right Nasal Cavity and Paranasal Sinuses: mucosa appears healthy and normalized, all sinuses are widely patent     Left Nasal Cavity and Paranasal Sinuses: mucosa appears healthy and normalized, all sinuses are widely patent     Septum:  Other:    The patient tolerated the procedure well.              Impression/Plan:    Thank you for allowing me to see your patient in consultation.  Based on my exam and review of the patients records, the following is my impression and plan:    1.Chronic rhinosinusitis    2. Chronic rhinitis     Discussed using Flonase QHS given her nasal obstruction symptoms, but she would like to hold off on further medication usage. She will start irrigating BID to help with her nighttime symptoms. Otherwise exam looked reassuring today. She will follow up on as needed basis.       F/u:     Heloise Purpura, M.D.    Professor and Villa Herb, M.D. Chairman  Department of Otolaryngology-Head and Neck Surgery  [employer]

## 2021-07-24 ENCOUNTER — Encounter: Admit: 2021-07-24 | Discharge: 2021-07-24 | Payer: MEDICARE

## 2021-07-24 ENCOUNTER — Ambulatory Visit: Admit: 2021-07-24 | Discharge: 2021-07-24 | Payer: MEDICARE

## 2021-07-24 DIAGNOSIS — M545 Low back pain: Secondary | ICD-10-CM

## 2021-07-24 DIAGNOSIS — M1990 Unspecified osteoarthritis, unspecified site: Secondary | ICD-10-CM

## 2021-07-24 DIAGNOSIS — M5416 Radiculopathy, lumbar region: Secondary | ICD-10-CM

## 2021-07-24 DIAGNOSIS — M25552 Pain in left hip: Secondary | ICD-10-CM

## 2021-07-24 DIAGNOSIS — R51 Headache: Secondary | ICD-10-CM

## 2021-07-24 DIAGNOSIS — M7918 Myalgia, other site: Secondary | ICD-10-CM

## 2021-07-24 DIAGNOSIS — K529 Noninfective gastroenteritis and colitis, unspecified: Secondary | ICD-10-CM

## 2021-07-24 DIAGNOSIS — T148XXA Other injury of unspecified body region, initial encounter: Secondary | ICD-10-CM

## 2021-07-24 DIAGNOSIS — M5136 Other intervertebral disc degeneration, lumbar region: Secondary | ICD-10-CM

## 2021-07-24 DIAGNOSIS — M5126 Other intervertebral disc displacement, lumbar region: Secondary | ICD-10-CM

## 2021-07-24 DIAGNOSIS — K219 Gastro-esophageal reflux disease without esophagitis: Secondary | ICD-10-CM

## 2021-08-13 ENCOUNTER — Encounter: Admit: 2021-08-13 | Discharge: 2021-08-13 | Payer: MEDICARE

## 2021-08-13 DIAGNOSIS — M7918 Myalgia, other site: Secondary | ICD-10-CM

## 2021-08-13 DIAGNOSIS — M5136 Other intervertebral disc degeneration, lumbar region: Secondary | ICD-10-CM

## 2021-08-14 ENCOUNTER — Encounter: Admit: 2021-08-14 | Discharge: 2021-08-14 | Payer: MEDICARE

## 2021-08-14 ENCOUNTER — Ambulatory Visit: Admit: 2021-08-14 | Discharge: 2021-08-14 | Payer: MEDICARE

## 2021-08-14 DIAGNOSIS — M7918 Myalgia, other site: Secondary | ICD-10-CM

## 2021-08-14 DIAGNOSIS — K529 Noninfective gastroenteritis and colitis, unspecified: Secondary | ICD-10-CM

## 2021-08-14 DIAGNOSIS — T148XXA Other injury of unspecified body region, initial encounter: Secondary | ICD-10-CM

## 2021-08-14 DIAGNOSIS — K219 Gastro-esophageal reflux disease without esophagitis: Secondary | ICD-10-CM

## 2021-08-14 DIAGNOSIS — M5416 Radiculopathy, lumbar region: Secondary | ICD-10-CM

## 2021-08-14 DIAGNOSIS — M5126 Other intervertebral disc displacement, lumbar region: Secondary | ICD-10-CM

## 2021-08-14 DIAGNOSIS — M545 Low back pain: Secondary | ICD-10-CM

## 2021-08-14 DIAGNOSIS — R51 Headache: Secondary | ICD-10-CM

## 2021-08-14 DIAGNOSIS — M5136 Other intervertebral disc degeneration, lumbar region: Secondary | ICD-10-CM

## 2021-08-14 DIAGNOSIS — M1990 Unspecified osteoarthritis, unspecified site: Secondary | ICD-10-CM

## 2021-08-14 DIAGNOSIS — M25552 Pain in left hip: Secondary | ICD-10-CM

## 2021-08-14 MED ORDER — METHYLPREDNISOLONE ACETATE 80 MG/ML IJ SUSP
80 mg | Freq: Once | INTRAMUSCULAR | 0 refills | Status: CP
Start: 2021-08-14 — End: ?
  Administered 2021-08-14: 15:00:00 80 mg via INTRAMUSCULAR

## 2021-08-14 MED ORDER — IOHEXOL 240 MG IODINE/ML IV SOLN
2.5 mL | Freq: Once | EPIDURAL | 0 refills | Status: CP
Start: 2021-08-14 — End: ?
  Administered 2021-08-14: 15:00:00 1 mL via EPIDURAL

## 2021-08-14 NOTE — Progress Notes

## 2021-08-14 NOTE — Patient Instructions
Procedure Completed Today: Lumbar Transforaminal Steroid Injection    Important information following your procedure today: You may drive today    Pain relief may not be immediate. It is possible you may even experience an increase in pain during the first 24-48 hours followed by a gradual decrease of your pain.  Though the procedure is generally safe and complications are rare, we do ask that you be aware of any of the following:   Any swelling, persistent redness, new bleeding, or drainage from the site of the injection.  You should not experience a severe headache.  You should not run a fever over 101? F.  New onset of sharp, severe back & or neck pain.  New onset of upper or lower extremity numbness or weakness.  New difficulty controlling bowel or bladder function after the injection.  New shortness of breath.    If any of these occur, please call to report this occurrence to a nurse at [phone]. If you are calling after 4:00 p.m., on weekends or holidays please call [phone] and ask to have the resident physician on call for the physician paged or go to your local emergency room.  You may experience soreness at the injection site. Ice can be applied at 20 minute intervals. Avoid application of direct heat, hot showers or hot tubs today.  Avoid strenuous activity today. You may resume your regular activities and exercise tomorrow.  Patients with diabetes may see an elevation in blood sugars for 7-10 days after the injection. It is important to pay close attention to your diet, check your blood sugars daily and report extreme elevations to the physician that treats your diabetes.  Patients taking a daily blood thinner can resume their regular dose this evening.  It is important that you take all medications ordered by your pain physician. Taking medication as ordered is an important part of your pain care plan. If you cannot continue the medication plan, please notify the physician.     Possible side effects to steroids that may occur:  Flushing or redness of the face  Irritability  Fluid retention  Change in women?s menses    The following medications were used: Depomedrol and Contrast Dye

## 2021-09-10 ENCOUNTER — Encounter: Admit: 2021-09-10 | Discharge: 2021-09-10 | Payer: MEDICARE

## 2021-09-10 DIAGNOSIS — M5416 Radiculopathy, lumbar region: Secondary | ICD-10-CM

## 2021-09-11 ENCOUNTER — Ambulatory Visit: Admit: 2021-09-11 | Discharge: 2021-09-11 | Payer: MEDICARE

## 2021-09-11 ENCOUNTER — Encounter: Admit: 2021-09-11 | Discharge: 2021-09-11 | Payer: MEDICARE

## 2021-09-11 DIAGNOSIS — M5136 Other intervertebral disc degeneration, lumbar region: Secondary | ICD-10-CM

## 2021-09-11 DIAGNOSIS — M1990 Unspecified osteoarthritis, unspecified site: Secondary | ICD-10-CM

## 2021-09-11 DIAGNOSIS — M5416 Radiculopathy, lumbar region: Secondary | ICD-10-CM

## 2021-09-11 DIAGNOSIS — T148XXA Other injury of unspecified body region, initial encounter: Secondary | ICD-10-CM

## 2021-09-11 DIAGNOSIS — M25552 Pain in left hip: Secondary | ICD-10-CM

## 2021-09-11 DIAGNOSIS — M545 Low back pain: Secondary | ICD-10-CM

## 2021-09-11 DIAGNOSIS — K529 Noninfective gastroenteritis and colitis, unspecified: Secondary | ICD-10-CM

## 2021-09-11 DIAGNOSIS — R51 Headache: Secondary | ICD-10-CM

## 2021-09-11 DIAGNOSIS — K219 Gastro-esophageal reflux disease without esophagitis: Secondary | ICD-10-CM

## 2021-09-11 DIAGNOSIS — M7918 Myalgia, other site: Secondary | ICD-10-CM

## 2021-09-11 DIAGNOSIS — M5126 Other intervertebral disc displacement, lumbar region: Secondary | ICD-10-CM

## 2021-09-11 MED ORDER — METHYLPREDNISOLONE ACETATE 80 MG/ML IJ SUSP
80 mg | Freq: Once | INTRAMUSCULAR | 0 refills | Status: CP
Start: 2021-09-11 — End: ?
  Administered 2021-09-11: 15:00:00 80 mg via INTRAMUSCULAR

## 2021-09-11 MED ORDER — IOHEXOL 240 MG IODINE/ML IV SOLN
2.5 mL | Freq: Once | EPIDURAL | 0 refills | Status: CP
Start: 2021-09-11 — End: ?
  Administered 2021-09-11: 15:00:00 1 mL via EPIDURAL

## 2021-09-11 NOTE — Progress Notes

## 2021-09-11 NOTE — Procedures
Attending Surgeon: Jerilynn Som, MD    Anesthesia: Local    Pre-Procedure Diagnosis:   1. Left buttock pain    2. Degenerative disc disease, lumbar    3. Lumbar radiculopathy        Post-Procedure Diagnosis:   1. Left buttock pain    2. Degenerative disc disease, lumbar    3. Lumbar radiculopathy        Pain Score: Four    [place] SPINE INJECT INTERLAM LMBR/SAC  Procedure: epidural - interlaminar    Laterality: n/a   on 09/11/2021 9:15 AM  Location: caudal -  Caudal      Consent:   Consent obtained: written  Consent given by: patient  Risks discussed: allergic reaction, bleeding, bruising, infection, no change or worsening in pain, reaction to medication and weakness  Alternatives discussed: alternative treatment, delayed treatment, no treatment and referral  Discussed with patient the purpose of the treatment/procedure, other ways of treating my condition, including no treatment/ procedure and the risks and benefits of the alternatives. Patient has decided to proceed with treatment/procedure.        Universal Protocol:  Relevant documents: relevant documents present and verified  Test results: test results available and properly labeled  Imaging studies: imaging studies available  Required items: required blood products, implants, devices, and special equipment available  Site marked: the operative site was marked  Patient identity confirmed: Patient identify confirmed verbally with patient.        Time out: Immediately prior to procedure a time out was called to verify the correct patient, procedure, equipment, support staff and site/side marked as required      Procedures Details:   Indications: pain   Prep: chlorhexidine  Patient position: prone  Estimated Blood Loss: minimal  Specimens: none  Number of Joints: 1  Approach: midline  Guidance: fluoroscopy  Contrast: Procedure confirmed with contrast under live fluoroscopy.  Needle and Epidural Catheter: tuohy  Needle size: 18 G  Injection procedure: Incremental injection, Negative aspiration for blood and Introduced needle  Patient tolerance: Patient tolerated the procedure well with no immediate complications. Pressure was applied, and hemostasis was accomplished.  Outcome: Pain improved  Comments: 2ml NS and 80mg  depomedrol inj  No complications    This patient's clinical history, exam, AND imaging support radiculopathy AND there is a significant impact on quality of life and function AND their pain score has been documented in this note AND the pain has been present for at least 4 weeks AND they have failed to improve with noninvasive conservative care.     This patient failed to have improvement of their pain with epidural injection. Given the severity of their pain, a repeat injection is being performed at least 14 days later with a different Approach.  This patient's pain is so severe it results in a significant degree of disability. Prior ESI has provided at least a 50% improvement in pain and function for at least 3 months. The patient's Primary Care Physician has been notified of the continuation of this procedure and prolonged repeat steroid use. The patient does not desire surgery.        Estimated blood loss: none or minimal  Specimens: none  Patient tolerated the procedure well with no immediate complications. Pressure was applied, and hemostasis was accomplished.

## 2021-09-11 NOTE — Progress Notes
[facility]  INTERVENTIONAL PAIN PROCEDURE HISTORY AND PHYSICAL    Chief Complaint: Pain    HISTORY OF PRESENT ILLNESS:  pain    Medical History:   Diagnosis Date   ? Acid reflux    ? Arthritis    ? Colitis    ? Fracture    ? Gluteal pain     left   ? Headache(784.0)    ? Left hip pain     superficial nature of pain with tenderness in a golf  ball size area in her left hip   ? Low back pain    ? Ruptured lumbar disc     with right-sided symptoms       Surgical History:   Procedure Laterality Date   ? HX SINUS SURGERY  2014   ? ANTROSTOMY ENDOSCOPY Bilateral 02/03/2016    Performed by Neldon Newport, MD at [facility] OR   ? FUNCTIONAL ENDOSCOPY SINUS SURGERY SPHENOETHMOIDECTOMY Bilateral 02/03/2016    Performed by Neldon Newport, MD at [facility] OR   ? EXCISION TISSUE ENDOSCOPY FRONTAL SINUS Bilateral 02/03/2016    Performed by Neldon Newport, MD at [facility] OR   ? FUNCTIONAL ENDOSCOPY SINUS SURGERY IMAGE-GUIDED, EXTRADURAL N/A 02/03/2016    Performed by Neldon Newport, MD at [facility] OR   ? CESAREAN SECTION      x 2   ? LUMBAR DISKECTOMY      microdiscectomy for lumbar spine; 3 years ago for ruptured disc visit dated 05/20/2011   ? SKIN CANCER EXCISION      melanoma   ? TONSILLECTOMY         family history includes Hypertension in an other family member; Stroke in an other family member.    Social History     Socioeconomic History   ? Marital status: Married   Tobacco Use   ? Smoking status: Former Smoker     Packs/day: 1.00     Years: 25.00     Pack years: 25.00   ? Smokeless tobacco: Never Used   ? Tobacco comment: quit 30 years ago ~1986   Substance and Sexual Activity   ? Alcohol use: No   ? Drug use: No       No Known Allergies    Vitals:    09/11/21 0905   BP: 136/74   BP Source: Arm, Right Upper   Pulse: 58   Temp: 36.3 ?C (97.4 ?F)   Resp: 12   SpO2: 99%   O2 Device: None (Room air)   TempSrc: Oral   PainSc: Four   Weight: 64.4 kg (142 lb)   Height: 167.6 cm (5' 6)     Pain Score: Four       REVIEW OF SYSTEMS: 10 point ROS obtained and negative except pain      PHYSICAL EXAM:unchanged        IMPRESSION:    1. Left buttock pain    2. Degenerative disc disease, lumbar    3. Lumbar radiculopathy         PLAN: Other caudal ESI

## 2021-12-11 ENCOUNTER — Ambulatory Visit: Admit: 2021-12-11 | Discharge: 2021-12-11 | Payer: MEDICARE

## 2021-12-11 ENCOUNTER — Encounter: Admit: 2021-12-11 | Discharge: 2021-12-11 | Payer: MEDICARE

## 2021-12-11 DIAGNOSIS — M7918 Myalgia, other site: Secondary | ICD-10-CM

## 2021-12-12 ENCOUNTER — Encounter: Admit: 2021-12-12 | Discharge: 2021-12-12 | Payer: MEDICARE

## 2021-12-26 ENCOUNTER — Encounter: Admit: 2021-12-26 | Discharge: 2021-12-26 | Payer: MEDICARE

## 2022-01-13 ENCOUNTER — Ambulatory Visit: Admit: 2022-01-13 | Discharge: 2022-01-13 | Payer: MEDICARE

## 2022-01-13 ENCOUNTER — Encounter: Admit: 2022-01-13 | Discharge: 2022-01-13 | Payer: MEDICARE

## 2022-01-13 DIAGNOSIS — J324 Chronic pansinusitis: Secondary | ICD-10-CM

## 2022-01-13 DIAGNOSIS — J310 Chronic rhinitis: Secondary | ICD-10-CM

## 2022-01-13 NOTE — Progress Notes
chief complaint  Subjective        HPI:  Tina Landry is a 76 y.o. female who I was asked to see in consultation for evaluation of chronic sinusitis.  Last seen pre-pandemic. Has been doing well.  Over he past 3 years, has been irrigating with normal saline.  Once a month, will use mupirocin.  Comes in today and has had some colored drainage on the left which has been a little worse.  Not all the time, just mostly in the mornings.  Headaches every day.  Has not been on any antibiotics for a while      The following portions of the patient's history were reviewed and updated as appropriate: allergies, current medications, past family history, past medical history, past social history, past surgical history and problem list.          ROS above reviewed      Current Outpatient Medications:   ?  Aspirin 81 mg Tab, Take 81 mg by mouth every morning.  , Disp: , Rfl:   ?  budesonide respule (PULMICORT) 0.5 mg/2 mL nebulizer solution, Drip method: drop 1/2 ampule into each nostril BID in migand position, Disp: 120 mL, Rfl: 3  ?  fexofenadine(+) (ALLEGRA) 180 mg tablet, Take 1 tablet by mouth daily., Disp: 90 tablet, Rfl: 3  ?  fluticasone (FLONASE) 50 mcg/actuation nasal spray, Apply 2 sprays to each nostril as directed daily. Shake bottle gently before using., Disp: 48 g, Rfl: 3  ?  KRILL OIL PO, Take  by mouth., Disp: , Rfl:   ?  MULTIVITAMIN PO, Take 1 Tab by mouth daily., Disp: , Rfl:   ?  mupirocin (BACTROBAN) 2 % topical ointment, Add nickel size amount to rinse bottle and rinse through both nostrils BID, Disp: 30 g, Rfl: 3  ?  mupirocin (BACTROBAN) 2 % topical ointment, Add nickel size amount to the rinse bottle BID and rinse through both nostrils, Disp: 30 g, Rfl: 3  ?  PROPRANOLOL HCL (INDERAL PO), Take 60 mg by mouth every morning., Disp: , Rfl:     General    Vitals:    01/13/22 1030   BP: (!) 162/80   Pulse: 55   Temp: 36.3 ?C (97.3 ?F)     General:  Well-developed, well-nourished  Communication and Voice:  Clear pitch and clarity, age appropriate    Head and Face  Inspection:  Normocephalic and atraumatic without masses or lesions  Palpation:  Facial skeleton intact without bony stepoffs, no sinus tenderness  Salivary Glands:  No masses or tenderness  Facial Strength:  Facial motility symmetric and full bilaterally             Left -  House-Brackman Grade 1/6             Right - House-Brackman Grade 1/6    ENT  External nose:  No scar or anatomic deformity  Internal Nose:  Septum intact and midline.  No edema, polyps, or rhinorrhea  Lips, Teeth, and gums:  Mucosa and teeth intact and viable  TMJ:  No pain to palpation with full mobility  Oral cavity/oropharynx:  No erythema or exudate with non-obstructive tonsils  Nasopharynx:  No masses or lesions with intact mucosa  Hypopharynx:  Intact mucosa without pooling of secretions  Larynx:  Full true vocal cord mobility without lesions or masses    Neck  Neck and Trachea:  Midline trachea without mass or lesion  Thyroid:  No mass or nodularity  Lymphatics:  No lymphadenopathy    Respiratory  Respiratory effort:  Equal inspiration & expiration without use of accessory muscles. No stridor    Cardiovascular  Peripheral Vascular:  Warm extremities with equal pulses    Eyes  Nystagmus: None  EOM: Equal extraocular motion bilaterally    Neuro/Psych/Balance  Orientation: Patient oriented to person, place, and time  Affect: Appropriate mood and affect  Gait: Intact with no imbalance  Cranial nerves: II-XII are intact    Ear  External canal: Left - Canal is patent with intact skin                             Right - Canal is patent with intact skin  Tympanic Membranes: Left - Clear and mobile                                            Right - Clear and mobile  Middle Ears: Left - Aerated, no effusion, no masses                          Right - Aerated, no effusion, no masses      Office Procedure    Nasal Endoscopy with left sided debridement    Indications: Was performed due to chronic rhinosinusitis    After topical anesthesia and decongestion had been obtained using aerosolized 1% lidocaine and oxymetazoline, a 30 degree rigid endoscope was placed into both nares with the patient in a sitting position. The following was observed:    Right Nasal Cavity and Paranasal Sinuses:     Interior of the nasal cavity: No masses or Purulence  Middle Meatus: no mass or purulence  Inferior turbinate: moderate hypertrophy  Middle Turbinate: no masses  Superior turbinate: no masses  Superior meatus: no masses  Sphenoethmoid recess: no purulence or polyps         Left Nasal Cavity and Paranasal Sinuses:     Interior of the nasal cavity: biofilm crusting in left maxillary sinus - debrided with suction and hartman forceps.  Inferior turbinate in place.  No other infection  Middle Meatus: no mass or purulence  Inferior turbinate: moderate hypertrophy  Middle Turbinate: no masses  Superior turbinate: no masses  Superior meatus: no masses  Sphenoethmoid recess: no purulence or polyps      Septum: midline  Other:    The patient tolerated the procedure well.                Impression/Plan:    Thank you for allowing me to see your patient in consultation.  Based on my exam and review of the patients records, the following is my impression and plan:    1.Chronic rhinosinusitis    2. Chronic rhinitis      Had been doing well.  Right side looks great.  Biofilm crusting on left removed.  Will have her do mupirocin irrigations for next week.  F/u in 6 weeks for check up    F/u:     Heloise Purpura, M.D.    Professor and Villa Herb, M.D. Chairman  Department of Otolaryngology-Head and Neck Surgery  [employer]

## 2022-01-28 ENCOUNTER — Ambulatory Visit: Admit: 2022-01-28 | Discharge: 2022-01-29 | Payer: MEDICARE

## 2022-01-28 ENCOUNTER — Encounter: Admit: 2022-01-28 | Discharge: 2022-01-28 | Payer: MEDICARE

## 2022-01-28 DIAGNOSIS — M1990 Unspecified osteoarthritis, unspecified site: Secondary | ICD-10-CM

## 2022-01-28 DIAGNOSIS — T148XXA Other injury of unspecified body region, initial encounter: Secondary | ICD-10-CM

## 2022-01-28 DIAGNOSIS — M7918 Myalgia, other site: Secondary | ICD-10-CM

## 2022-01-28 DIAGNOSIS — M5126 Other intervertebral disc displacement, lumbar region: Secondary | ICD-10-CM

## 2022-01-28 DIAGNOSIS — M25552 Pain in left hip: Secondary | ICD-10-CM

## 2022-01-28 DIAGNOSIS — K219 Gastro-esophageal reflux disease without esophagitis: Secondary | ICD-10-CM

## 2022-01-28 DIAGNOSIS — K529 Noninfective gastroenteritis and colitis, unspecified: Secondary | ICD-10-CM

## 2022-01-28 DIAGNOSIS — M545 Low back pain: Secondary | ICD-10-CM

## 2022-01-28 DIAGNOSIS — R51 Headache: Secondary | ICD-10-CM

## 2022-01-28 NOTE — Progress Notes
Todays visit took place via face-to-face encounter utilizing Zoom application. Total time 20 minutes.       [facility] CLIN[facility] NOTE       SUBJECTIVE:     Chronic back and buttock pain on left   Last seen in Dec   She had TPi and ischisal bursa inj   No relief after this trx   Pain is the same      Ongoing 15 yrs   Microdiscectomy lumbar area?(L4,L5 per patient)?and this pain onset 6 weeks after this surgery?  Her right sided sciatic pain improved after surgery but her left buttock pain onset since then   Pain is only when she sits or lays   No pain with standing or walking   Denies radiating sx   Mild back pain but this is tolerable   Pain limits quality of life   She has to modify her position to manage the pain   Has tried meds and inj in past with no relief   PT not helpful   Denies bowel or bladder dysfunction, no weakness  VAS 6-7/10?  ?  MEDS   Lidocaine   NSAIDS in past but GI issues so doesn't take this now   Acetaminophen   ?  Hx of use of gabapentin in past for migraines?  Cannot tolerate the side effects   ?  TRX   Had L5 ESI and Caudal injection recently with 100% relief of back pain but did not help her buttock pain   Piriformis inj   Cortisone IM - no relief   PT- no relief?         Review of Systems    Current Outpatient Medications:   ?  Aspirin 81 mg Tab, Take 81 mg by mouth every morning.  , Disp: , Rfl:   ?  budesonide respule (PULMICORT) 0.5 mg/2 mL nebulizer solution, Drip method: drop 1/2 ampule into each nostril BID in migand position, Disp: 120 mL, Rfl: 3  ?  fexofenadine(+) (ALLEGRA) 180 mg tablet, Take 1 tablet by mouth daily., Disp: 90 tablet, Rfl: 3  ?  fluticasone (FLONASE) 50 mcg/actuation nasal spray, Apply 2 sprays to each nostril as directed daily. Shake bottle gently before using., Disp: 48 g, Rfl: 3  ?  KRILL OIL PO, Take  by mouth., Disp: , Rfl:   ?  MULTIVITAMIN PO, Take 1 Tab by mouth daily., Disp: , Rfl:   ?  mupirocin (BACTROBAN) 2 % topical ointment, Add nickel size amount to rinse bottle and rinse through both nostrils BID, Disp: 30 g, Rfl: 3  ?  mupirocin (BACTROBAN) 2 % topical ointment, Add nickel size amount to the rinse bottle BID and rinse through both nostrils, Disp: 30 g, Rfl: 3  ?  PROPRANOLOL HCL (INDERAL PO), Take 60 mg by mouth every morning., Disp: , Rfl:   No Active Allergies  Physical Exam  Vitals:    01/28/22 0815   PainSc: Three   Weight: 65.8 kg (145 lb)   Height: 161.6 cm (5' 3.62)     Oswestry Total Score:: (P) 10  Pain Score: Three  Body mass index is 25.19 kg/m?Marland Kitchen    General: Alert, oriented, mild distress  HEENT: normocephalic  Resp: Non labored breathing, no distress  MS: sits during call.  Behavior: Calm, cooperative, behavior and speech normal.          IMPRESSION:  1. Left buttock pain          PLAN:  Reviewed films   No acute changes on pelvis MRI   Discussed hip degenerative changes   Will review with Dr Welton Flakes for other options  Shots minimally helpful   Has tried other meds and not wanting to optimize any further drug regimens at this time due to side effects   Lidocaine helps some and will cont this for now   She will call back if things change or worsen

## 2022-07-14 ENCOUNTER — Ambulatory Visit: Admit: 2022-07-14 | Discharge: 2022-07-14 | Payer: MEDICARE

## 2022-07-14 ENCOUNTER — Encounter: Admit: 2022-07-14 | Discharge: 2022-07-14 | Payer: MEDICARE

## 2022-07-14 DIAGNOSIS — J310 Chronic rhinitis: Secondary | ICD-10-CM

## 2022-07-14 DIAGNOSIS — J324 Chronic pansinusitis: Secondary | ICD-10-CM

## 2022-07-14 NOTE — Progress Notes
HPI:  Tina Landry is a 76 y.o. female who was last seen on 01/13/22. Was recommended to continue mupirocin sinus rinses. States that they only mildly helped. Still do mupirocin rinses if she has the sensation that something is building up. Now returning for 6 mo follow up. At last visit, debrided the left nare of crusting. She is still continuing to experience significant post nasal drainage described as being gray in color, thick in consistency, difficult to mobilize. Does not contribute to cough. Poor sense of smell. Denies wheezing. Takes mucinex in the am. Does not take antihistamine, nasal steroid.    Wakes up every morning with headache in frontal distribution. This is a longstanding issue. Also pain in left maxillary premolar present for the past 3-4 mos. Feels like a pressure. Has been to the dentist twice for this but has been told everything is fine. Denies fever/chills.     ?      ?  ROS above reviewed  ?  ?  Current Outpatient Medications:   ?  Aspirin 81 mg Tab, Take 81 mg by mouth every morning.  , Disp: , Rfl:   ?  budesonide respule (PULMICORT) 0.5 mg/2 mL nebulizer solution, Drip method: drop 1/2 ampule into each nostril BID in migand position, Disp: 120 mL, Rfl: 3  ?  fexofenadine(+) (ALLEGRA) 180 mg tablet, Take 1 tablet by mouth daily., Disp: 90 tablet, Rfl: 3  ?  fluticasone (FLONASE) 50 mcg/actuation nasal spray, Apply 2 sprays to each nostril as directed daily. Shake bottle gently before using., Disp: 48 g, Rfl: 3  ?  KRILL OIL PO, Take  by mouth., Disp: , Rfl:   ?  MULTIVITAMIN PO, Take 1 Tab by mouth daily., Disp: , Rfl:   ?  mupirocin (BACTROBAN) 2 % topical ointment, Add nickel size amount to rinse bottle and rinse through both nostrils BID, Disp: 30 g, Rfl: 3  ?  mupirocin (BACTROBAN) 2 % topical ointment, Add nickel size amount to the rinse bottle BID and rinse through both nostrils, Disp: 30 g, Rfl: 3  ?  PROPRANOLOL HCL (INDERAL PO), Take 60 mg by mouth every morning., Disp: , Rfl:   ? Electronically signed by Neldon Newport, MD at 01/13/22 1051

## 2022-07-14 NOTE — Progress Notes
Date of Service: 07/14/2022    Subjective:             Tina Landry is a 76 y.o. female.    History of Present Illness       Review of Systems   Constitutional: Negative.    HENT: Positive for postnasal drip.    Eyes: Negative.    Respiratory: Negative.    Cardiovascular: Negative.    Gastrointestinal: Negative.    Endocrine: Negative.    Genitourinary: Negative.    Musculoskeletal: Negative.    Skin: Negative.    Allergic/Immunologic: Negative.    Neurological: Positive for headaches.   Hematological: Negative.    Psychiatric/Behavioral: Negative.          Objective:          Aspirin 81 mg Tab Take 81 mg by mouth every morning.      MULTIVITAMIN PO Take 1 Tab by mouth daily.    mupirocin (BACTROBAN) 2 % topical ointment Add nickel size amount to rinse bottle and rinse through both nostrils BID    mupirocin (BACTROBAN) 2 % topical ointment Add nickel size amount to the rinse bottle BID and rinse through both nostrils    PROPRANOLOL HCL (INDERAL PO) Take 60 mg by mouth every morning.     Vitals:    07/14/22 1013   BP: 133/79   BP Source: Arm, Left Upper   Pulse: 54   Temp: 36.4 C (97.6 F)   PainSc: Zero   Weight: 65.8 kg (145 lb)   Height: 160 cm (5\' 3" )     Body mass index is 25.69 kg/m.     Physical Exam         Assessment and Plan:

## 2022-07-14 NOTE — Progress Notes
chief complaint  Subjective        HPI:  Tina Landry is a 76 y.o. female who I was asked to see in consultation for evaluation of chronic sinusitis. Tina Landry is a 76 y.o. female who was last seen on 01/13/22. Was recommended to continue mupirocin sinus rinses. States that they only mildly helped. Still do mupirocin rinses if she has the sensation that something is building up. Now returning for 6 mo follow up. At last visit, debrided the left nare of crusting. She is still continuing to experience significant post nasal drainage described as being gray in color, thick in consistency, difficult to mobilize. Does not contribute to cough. Poor sense of smell. Denies wheezing. Takes mucinex in the am. Does not take antihistamine, nasal steroid.    Wakes up every morning with headache in frontal distribution. This is a longstanding issue. Also pain in left maxillary premolar present for the past 3-4 mos. Feels like a pressure. Has been to the dentist twice for this but has been told everything is fine. Denies fever/chills.       The following portions of the patient's history were reviewed and updated as appropriate: allergies, current medications, past family history, past medical history, past social history, past surgical history and problem list.          ROS above reviewed      Current Outpatient Medications:   ?  Aspirin 81 mg Tab, Take 81 mg by mouth every morning.  , Disp: , Rfl:   ?  MULTIVITAMIN PO, Take 1 Tab by mouth daily., Disp: , Rfl:   ?  mupirocin (BACTROBAN) 2 % topical ointment, Add nickel size amount to rinse bottle and rinse through both nostrils BID, Disp: 30 g, Rfl: 3  ?  mupirocin (BACTROBAN) 2 % topical ointment, Add nickel size amount to the rinse bottle BID and rinse through both nostrils, Disp: 30 g, Rfl: 3  ?  PROPRANOLOL HCL (INDERAL PO), Take 60 mg by mouth every morning., Disp: , Rfl:     General    Vitals:    07/14/22 1013   BP: 133/79   Pulse: 54   Temp: 36.4 ?C (97.6 ?F)     General: Well-developed, well-nourished  Communication and Voice:  Clear pitch and clarity, age appropriate    Head and Face  Inspection:  Normocephalic and atraumatic without masses or lesions  Palpation:  Facial skeleton intact without bony stepoffs, no sinus tenderness  Salivary Glands:  No masses or tenderness  Facial Strength:  Facial motility symmetric and full bilaterally             Left -  House-Brackman Grade 1/6             Right - House-Brackman Grade 1/6    ENT  External nose:  No scar or anatomic deformity  Internal Nose:  Septum intact and midline.  No edema, polyps, or rhinorrhea  Lips, Teeth, and gums:  Mucosa and teeth intact and viable  TMJ:  No pain to palpation with full mobility  Oral cavity/oropharynx:  No erythema or exudate with non-obstructive tonsils  Nasopharynx:  No masses or lesions with intact mucosa  Hypopharynx:  Intact mucosa without pooling of secretions  Larynx:  Full true vocal cord mobility without lesions or masses    Neck  Neck and Trachea:  Midline trachea without mass or lesion  Thyroid:  No mass or nodularity  Lymphatics:  No lymphadenopathy    Respiratory  Respiratory effort:  Equal inspiration & expiration without use of accessory muscles. No stridor    Cardiovascular  Peripheral Vascular:  Warm extremities with equal pulses    Eyes  Nystagmus: None  EOM: Equal extraocular motion bilaterally    Neuro/Psych/Balance  Orientation: Patient oriented to person, place, and time  Affect: Appropriate mood and affect  Gait: Intact with no imbalance  Cranial nerves: II-XII are intact    Ear  External canal: Left - Canal is patent with intact skin                             Right - Canal is patent with intact skin  Tympanic Membranes: Left - Clear and mobile                                            Right - Clear and mobile  Middle Ears: Left - Aerated, no effusion, no masses                          Right - Aerated, no effusion, no masses      Office Procedure    Nasal Endoscopy    Indications: Was performed due to chronic rhinosinusitis    After topical anesthesia and decongestion had been obtained using aerosolized 1% lidocaine and oxymetazoline, a 30 degree rigid endoscope was placed into both nares with the patient in a sitting position. The following was observed:    Right Nasal Cavity and Paranasal Sinuses:     Interior of the nasal cavity: No masses or Purulence; mucosa looks good; all antrostomies patent  Middle Meatus: no mass or purulence  Inferior turbinate: moderate hypertrophy  Middle Turbinate: no masses  Superior turbinate: no masses  Superior meatus: no masses  Sphenoethmoid recess: no purulence or polyps         Left Nasal Cavity and Paranasal Sinuses:     Interior of the nasal cavity:mucosa looks good and antrosotmies of max, sphenoid and frontal all patent.  Thickened mucus in left max and sphenoethmoid recess - cultured  Middle Meatus: no mass or purulence  Inferior turbinate: moderate hypertrophy  Middle Turbinate: no masses  Superior turbinate: no masses  Superior meatus: no masses  Sphenoethmoid recess: no purulence or polyps      Septum: midline  Other:    The patient tolerated the procedure well.                Impression/Plan:    Thank you for allowing me to see your patient in consultation.  Based on my exam and review of the patients records, the following is my impression and plan:    1.Chronic rhinosinusitis    2. Chronic rhinitis    - culture taken and will call back with abx based on the culture.  Continue saline irrigations      F/u: prn    Heloise Purpura, M.D.    Professor and Villa Herb, M.D. Chairman  Department of Otolaryngology-Head and Neck Surgery  [employer] [facility] [employer]

## 2022-07-20 ENCOUNTER — Encounter: Admit: 2022-07-20 | Discharge: 2022-07-20 | Payer: MEDICARE

## 2022-07-20 MED ORDER — LEVOFLOXACIN 500 MG PO TAB
500 mg | ORAL_TABLET | Freq: Every day | ORAL | 0 refills | 7.00000 days | Status: AC
Start: 2022-07-20 — End: ?

## 2022-07-20 NOTE — Telephone Encounter
Called patient with culture results per Dr. Wyline Copas.  levaquin sent to preferred pharmacy with tendon strain precautions verbalized

## 2023-01-26 ENCOUNTER — Ambulatory Visit: Admit: 2023-01-26 | Discharge: 2023-01-26 | Payer: MEDICARE

## 2023-01-26 ENCOUNTER — Encounter: Admit: 2023-01-26 | Discharge: 2023-01-26 | Payer: MEDICARE

## 2023-01-26 DIAGNOSIS — M7918 Myalgia, other site: Secondary | ICD-10-CM

## 2023-01-26 DIAGNOSIS — M25552 Pain in left hip: Secondary | ICD-10-CM

## 2023-01-26 DIAGNOSIS — R51 Headache: Secondary | ICD-10-CM

## 2023-01-26 DIAGNOSIS — T148XXA Other injury of unspecified body region, initial encounter: Secondary | ICD-10-CM

## 2023-01-26 DIAGNOSIS — K219 Gastro-esophageal reflux disease without esophagitis: Secondary | ICD-10-CM

## 2023-01-26 DIAGNOSIS — M545 Low back pain: Secondary | ICD-10-CM

## 2023-01-26 DIAGNOSIS — K529 Noninfective gastroenteritis and colitis, unspecified: Secondary | ICD-10-CM

## 2023-01-26 DIAGNOSIS — J320 Chronic maxillary sinusitis: Secondary | ICD-10-CM

## 2023-01-26 DIAGNOSIS — M5126 Other intervertebral disc displacement, lumbar region: Secondary | ICD-10-CM

## 2023-01-26 DIAGNOSIS — M1990 Unspecified osteoarthritis, unspecified site: Secondary | ICD-10-CM

## 2023-01-26 DIAGNOSIS — J324 Chronic pansinusitis: Secondary | ICD-10-CM

## 2023-01-26 DIAGNOSIS — J310 Chronic rhinitis: Secondary | ICD-10-CM

## 2023-01-26 NOTE — Progress Notes
chief complaint  Subjective        HPI:  Ataya is a 77 y.o. female who I was asked to see in consultation for evaluation of chronic sinusitis. She states that for the past 6 weeks she has been coughing gray clumpish material. Symptoms also include purulent drainage, morning frontal headaches, and  morning nasal congestion. She has been doing saline irrigations every day with the mupirocin ointment. No topical medications.     Her last clinic visit was 07/14/22 where a culture was taken and grew back Achromobacter and staph and she was placed on Levaquin for 2 weeks.  Symptoms persisted and it has been a good year in which she has had persistnet and now worsening draiange.      The following portions of the patient's history were reviewed and updated as appropriate: allergies, current medications, past family history, past medical history, past social history, past surgical history and problem list.          ROS above reviewed      Current Outpatient Medications:     Aspirin 81 mg Tab, Take one tablet by mouth every morning.  , Disp: , Rfl:     lidocaine (LIDODERM) 5 % topical patch, 1 PATCH TOPICALLY EVERY 12 HOURS AS NEEDED FOR FOR PAIN, Disp: , Rfl:     MULTIVITAMIN PO, Take 1 Tab by mouth daily., Disp: , Rfl:     mupirocin (BACTROBAN) 2 % topical ointment, Add nickel size amount to rinse bottle and rinse through both nostrils BID, Disp: 30 g, Rfl: 3    mupirocin (BACTROBAN) 2 % topical ointment, Add nickel size amount to the rinse bottle BID and rinse through both nostrils, Disp: 30 g, Rfl: 3    PROPRANOLOL HCL (INDERAL PO), Take 60 mg by mouth every morning., Disp: , Rfl:     SUMAtriptan succinate (IMITREX) 50 mg tablet, Take  by mouth., Disp: , Rfl:     trimethoprim (TRIMPEX) 100 mg tablet, Take one tablet by mouth at bedtime daily., Disp: , Rfl:     General    Vitals:    01/26/23 0928   BP: 120/82   Pulse: 56   Temp: 36.2 ?C (97.2 ?F)     General:  Well-developed, well-nourished  Communication and Voice: Clear pitch and clarity, age appropriate    Head and Face  Inspection:  Normocephalic and atraumatic without masses or lesions  Palpation:  Facial skeleton intact without bony stepoffs, no sinus tenderness  Salivary Glands:  No masses or tenderness  Facial Strength:  Facial motility symmetric and full bilaterally             Left -  House-Brackman Grade 1/6             Right - House-Brackman Grade 1/6    ENT  External nose:  No scar or anatomic deformity  Internal Nose:  Septum intact and midline.  No edema, polyps, or rhinorrhea  Lips, Teeth, and gums:  Mucosa and teeth intact and viable  TMJ:  No pain to palpation with full mobility  Oral cavity/oropharynx:  No erythema or exudate with non-obstructive tonsils  Nasopharynx:  No masses or lesions with intact mucosa  Hypopharynx:  Intact mucosa without pooling of secretions  Larynx:  Full true vocal cord mobility without lesions or masses    Neck  Neck and Trachea:  Midline trachea without mass or lesion  Thyroid:  No mass or nodularity  Lymphatics:  No lymphadenopathy    Respiratory  Respiratory effort:  Equal inspiration & expiration without use of accessory muscles. No stridor    Cardiovascular  Peripheral Vascular:  Warm extremities with equal pulses    Eyes  Nystagmus: None  EOM: Equal extraocular motion bilaterally    Neuro/Psych/Balance  Orientation: Patient oriented to person, place, and time  Affect: Appropriate mood and affect  Gait: Intact with no imbalance  Cranial nerves: II-XII are intact    Ear  External canal: Left - Canal is patent with intact skin                             Right - Canal is patent with intact skin  Tympanic Membranes: Left - Clear and mobile                                            Right - Clear and mobile  Middle Ears: Left - Aerated, no effusion, no masses                          Right - Aerated, no effusion, no masses      Office Procedure    Nasal Endoscopy    Indications: Was performed due to chronic rhinosinusitis    After topical anesthesia and decongestion had been obtained using aerosolized 1% lidocaine and oxymetazoline, a 30 degree rigid endoscope was placed into both nares with the patient in a sitting position. The following was observed:    Right Nasal Cavity and Paranasal Sinuses:     Interior of the nasal cavity: No masses or Purulence; edema of lateral wall and inferior turb  Middle Meatus: no mass or purulence  Inferior turbinate: moderate hypertrophy  Middle Turbinate: no masses  Superior turbinate: no masses  Superior meatus: no masses  Sphenoethmoid recess: no purulence or polyps         Left Nasal Cavity and Paranasal Sinuses:     Interior of the nasal cavity: biofilm crusting and purulence that has worsened - unable to fully debride and remove crusting in left max sinus  Middle Meatus: no mass or purulence  Inferior turbinate: moderate hypertrophy  Middle Turbinate: no masses  Superior turbinate: no masses  Superior meatus: no masses  Sphenoethmoid recess: no purulence or polyps      Septum: midline  Other:    The patient tolerated the procedure well.                Impression/Plan:    Thank you for allowing me to see your patient in consultation.  Based on my exam and review of the patients records, the following is my impression and plan:    1.Chronic rhinosinusitis    2. Chronic rhinitis    Persisent biofilm sinusitis and unable to clear maxillary sinuses.  Will want to revise the maxillary antrostomies bilaterally and be more aggressive with the inferior turbs to allow better access to the max sinuses.  Risks and benefits discussed and she wishes to proceed      F/u:     Heloise Purpura, M.D.    Professor and Villa Herb, M.D. Chairman  Department of Otolaryngology-Head and Neck Surgery  [employer] [facility] [employer]

## 2023-01-26 NOTE — Pre-Anesthesia Patient Instructions
PREPROCEDURE INFORMATION    Arrival at the hospital  [facility]  [address]  [place], [place] [address]    Park in the P5 parking garage located at [address], [place], [place] [address].   If parking in the P5 garage, take the east elevators in the parking garage to the second level and walk to the entrance of the [employer].    Enter through the 1st floor main entrance and check in with Information Desk.   If you are a woman between the ages of 20 and 73, and have not had a hysterectomy, you will be asked for a urine sample prior to surgery.  Please do not urinate before arriving in the Surgery Waiting Room.  Once there, check in and let the attendant know if you need to provide a sample.    You will receive a call with your surgery arrival time between 2:30pm and 4:30pm the last business day before your procedure.  If you do not receive a call, please call [phone] before 4:30pm or [phone] after 4:30pm.    Eating or drinking before surgery  Nothing to eat after 11:00pm the night before your surgery including gum, mints and candy. You may have clear liquids up to 2 hours before your surgery time.     If you have received specific instructions from your surgeon, please follow those.     Clear Liquid Examples:   Water  Clear juice - Apple or cranberry (no pulp or orange juice) - If diabetic blood sugar must be <200  Coffee and tea with or without sugar (no cream)   Sports drinks - Powerade/Gatorade   Soda   Bowel Prep solutions only if ordered by your surgeon      Planning transportation for outpatient procedure  For your safety, you will need to arrange for a responsible ride/person to accompany you home due to sedation or anesthesia with your procedure.  An [employer], taxi or other public transportation driver is not considered a responsible person to accompany you home.    Bath/Shower Instructions  Take a bath or shower with antibacterial soap the night before and the morning of your procedure. Use clean towels.  Put on clean clothes after bath or shower.  Avoid using lotion and oils.  If you are having surgery above the waist, wear a shirt that fastens up the front.  Sleep on clean sheets if bath or shower is done the night before procedure.    Morning of your procedure:  Brush your teeth and tongue  Do not smoke, vape, chew or user any tobacco products.  Do not shave the area where you will have surgery.  Remove nail polish, makeup and all jewelry (including piercings) before coming to the hospital.  Dress in clean, loose, comfortable clothing.    Valuables  Leave money, credit cards, jewelry, and any other valuables at home. If medications are being filled and picked up at a [place] pharmacy, payment will be needed. The [facility] is not responsible for the loss or breakage of personal items.    What to bring to the hospital  ID/Insurance card  Medical Device card  Official documents for legal guardianship  Copy of your Living Will, Advanced Directives, and/or Durable Power of Attorney. If you have these documents, please bring them to the admissions office on the day of your surgery to be scanned into your records.  Do not bring medications from home unless instructed  by a pharmacist.  Dan Humphreys, cane, or motorized scooter  Cases for glasses/hearing aids/contact lens (bring solutions for contacts)     Notify us at [facility]: [phone] on the day of your procedure if:  You need to cancel your procedure.  You are going to be late.    Notify your surgeon if:  There is a possibility that you are pregnant.  You become ill with a cough, fever, sore throat, nausea, vomiting or flu-like symptoms.  You have any open wounds/sores that are red, painful, draining, or are new since you last saw the doctor.  You need to cancel your procedure.    Preparing to get your medications at discharge  Your surgeon may prescribe you medications to take after your procedure.  If you like the convenience of having your medications filled here at [facility], please do the following:  Go to [facility] pharmacy after your [facility] appointment to put a credit card on file.  Call [facility] pharmacy at [phone] (Monday-Friday 7am-9pm or Saturday and Sunday 9am-5pm) to put a credit card on file.  Bring a credit card or cash on the day of your procedure- please leave with a family member rather than bringing it into the preop area.    Current Visitor Policy:  Visitors must be free of fever and symptoms to be in our facilities.  No more than 2 visitors per patient are allowed.  Additional guidelines may vary, based on patient care area or patient's condition.  Patients in semiprivate rooms may have visitors, but visits should be coordinated so only two total visitors are in a room at a time due to space limitations.  Children younger than age 1 are allowed to visit inpatients.    Thank you for participating in your Preoperative Assessment visit today.    If you have any changes to your health or hospitalizations between now and your surgery, please call us at [phone].    Instructions given to patient via: email    Thank you for completing your phone assessment with me today.  If you have any changes in condition or hospitalizations prior to your procedure, please contact Marylu Lund, RN, at [phone] or call the [facility] clinic at  [phone].

## 2023-01-26 NOTE — Progress Notes
Date of Service: 01/26/2023    Subjective:             Tina Landry is a 77 y.o. female.    History of Present Illness       Review of Systems   Constitutional: Negative.    HENT:  Positive for postnasal drip and rhinorrhea.    Eyes: Negative.    Respiratory: Negative.     Cardiovascular: Negative.    Gastrointestinal: Negative.    Endocrine: Negative.    Genitourinary: Negative.    Musculoskeletal: Negative.    Skin: Negative.    Allergic/Immunologic: Negative.    Neurological: Negative.    Hematological: Negative.    Psychiatric/Behavioral: Negative.         Objective:          Aspirin 81 mg Tab Take one tablet by mouth every morning.      lidocaine (LIDODERM) 5 % topical patch 1 PATCH TOPICALLY EVERY 12 HOURS AS NEEDED FOR FOR PAIN    MULTIVITAMIN PO Take 1 Tab by mouth daily.    mupirocin (BACTROBAN) 2 % topical ointment Add nickel size amount to rinse bottle and rinse through both nostrils BID    mupirocin (BACTROBAN) 2 % topical ointment Add nickel size amount to the rinse bottle BID and rinse through both nostrils    PROPRANOLOL HCL (INDERAL PO) Take 60 mg by mouth every morning.    SUMAtriptan succinate (IMITREX) 50 mg tablet Take  by mouth.    trimethoprim (TRIMPEX) 100 mg tablet Take one tablet by mouth at bedtime daily.     Vitals:    01/26/23 0928   BP: 120/82   BP Source: Arm, Left Upper   Pulse: 56   Temp: 36.2 C (97.2 F)   PainSc: Zero   Weight: 65.8 kg (145 lb)   Height: 160 cm (5' 2.99")     Body mass index is 25.69 kg/m.     Physical Exam         Assessment and Plan:

## 2023-01-26 NOTE — Pre-Anesthesia Medication Instructions
YOUR MEDICATION LIST     Aspirin 81 mg Tab Take one tablet by mouth every morning.      lidocaine (LIDODERM) 5 % topical patch 1 PATCH TOPICALLY EVERY 12 HOURS AS NEEDED FOR FOR PAIN    MULTIVITAMIN PO Take 1 Tab by mouth daily.    mupirocin (BACTROBAN) 2 % topical ointment Add nickel size amount to rinse bottle and rinse through both nostrils BID    mupirocin (BACTROBAN) 2 % topical ointment Add nickel size amount to the rinse bottle BID and rinse through both nostrils    PROPRANOLOL HCL (INDERAL PO) Take 60 mg by mouth every morning.    SUMAtriptan succinate (IMITREX) 50 mg tablet Take  by mouth.    trimethoprim (TRIMPEX) 100 mg tablet Take one tablet by mouth at bedtime daily.         YOUR  MEDICATION INSTRUCTIONS FOR SURGERY    Please continue taking your medications as your doctor has told you to UNLESS it is listed to stop below.    14 DAYS BEFORE SURGERY  Do  not take the following vitamins, herbals, and supplements:  Multivitamins  Do not  start any new vitamins, herbals, or natural supplements before surgery.      7 DAYS BEFORE SURGERY  DO NOT TAKE the following anti-inflammatory medications such as ibuprofen (Advil, Motrin) and naproxen (Aleve)   You may use acetaminophen (Tylenol)       BLOOD THINNER instructions:   Aspirin-Please hold for 7 days prior to your procedure (taking for heart health)     DAY BEFORE SURGERY  TAKE your medications the day before surgery as usual unless told differently.      MORNING OF SURGERY  DO NOT take these medications:  Remaining vitamins/supplements  Ointments/creams/lotions  Sumatriptan     TAKE these medications with a sip (1-2 ounces) of water:  Propranolol  Use all inhalers, nasal sprays, and eye drops as usual       Please contact the clinic pharmacist with any changes to your medication list or medication questions before surgery.  E-mail:  [email or web address]   Phone: [phone]     Before going home from the hospital, please ask your doctor when you should re-start any medicine that was stopped for surgery.

## 2023-01-26 NOTE — Patient Instructions
General Instructions for Surgery Patients     Your Surgery is scheduled for  Monday, 2/12      Preparing for Surgery                  ?          Hold Vitamin E, Vitamin A, Fish oil, multivitamin, and herbal supplements for 14                           days prior to surgery.  Hold Ibuprofen (Advil, Motrin), Naproxen (Aleve), and any                           other Non-Steroidal Anti-inflammatory medications for 7 days prior to surgery. Tylenol                           is ok right up to day of surgery. If you take aspirin or any blood thinners,   please contact the prescribing physician for recommendation regarding holding   these medications prior to surgery.                                       Do not stop taking any heart medications or aspirin without seeking the                           Approval of your Cardiologist.               ?          Refrain from smoking two weeks before and two weeks after surgery.  Nicotine                           and tobacco smoke delays healing and can result in scarring. This is the perfect                           time to give up the habit.               ?          Do not eat or drink anything, including water, after midnight the night before your                           surgery.               ?          Arrange for someone to take you home from the hospital.  You will not be  allowed to drive or leave alone.  Arrange to have someone to be with you the                          first 24 hours following surgery.                     ____    Bonita Quin will receive a call from the Surgery Scheduling Department the day  before your surgery to confirm your arrival time and location. This phone                    call will take place between 2:30 and 4:30.                             If you do not receive a call from the operating room representative about         your arrival time by 4pm, call the Preoperative Evaluation Clinic. Between 3:30pm and 6pm - Call [phone] or [phone] (from                           [place] only). After 6pm - Call [phone].     The Day of Surgery                  ?          Do not eat or drink anything, including water, after midnight the morning of                           surgery.                            Essential medication may be taken with a sip of water.               ?          Wear loose-fitting clothes that fasten in front or back.  Avoid clothing that pulls                           over your head.               ?          Leave all valuables at home; do not wear jewelry.               ?          Do not wear any facial or eye make-up.  Avoid nail polish.               ?          You may wear glasses but do not wear contact lenses.              ?          If you wear dentures, keep them in.               ?          Bring ID and insurance card with you.      If you are concerned about anything you consider significant, call Dr. Johnna Acosta nurse, Emree Locicero at [phone]. After hours ask to speak to the ENT doctor on call.           Surgery location and parking instructions     [facility]  [address].  [place], [place] [address]     [facility] is on the main hospital campus at the [place] corner of [address] and [address].      We are pleased to serve you and appreciate your trust in our care. On the day of your procedure:  Park in the  P[address] parking garage on the corner of [address] and [address]. The fee is $3 with validation; bring your ticket to any information desk for validation.  Enter [facility] through the main entrance on the street level.  Check in at the admitting desk on Level 1.  When your registration is complete, check in with the waiting room attendant. You will be escorted to the location of your procedure.  Your family or friends may wait comfortably in the common areas on Level 1.

## 2023-01-26 NOTE — Unmapped
[facility] Anesthesia Pre-Procedure Evaluation    Name: Tina Landry      MRN: [id number]     DOB: 1946/05/03     Age: 77 y.o.     Sex: female   _________________________________________________________________________     Procedure Info:   Procedure Information       Date/Time: 02/08/23 1205    Procedure: ENDOSCOPY NASAL/ SINUS WITH EXCISION TISSUE MAXILLARY SINUS (Bilateral) - 1 hr    Location: CA3 EX52 / CA3 OR/Periop    Surgeons: Neldon Newport, MD            Physical Assessment  Vital Signs (last filed in past 24 hours):  BP: 120/82 (01/30 0928)  Temp: 36.2 ?C (97.2 ?F) (01/30 8413)  Pulse: 56 (01/30 0928)  Height: 160 cm (5' 2.99) (01/30 2440)  Weight: 65.8 kg (145 lb) (01/30 1027)      Patient History   No Known Allergies     Current Medications    Medication Directions   Aspirin 81 mg Tab Take one tablet by mouth every morning.     lidocaine (LIDODERM) 5 % topical patch 1 PATCH TOPICALLY EVERY 12 HOURS AS NEEDED FOR FOR PAIN   MULTIVITAMIN PO Take 1 Tab by mouth daily.   mupirocin (BACTROBAN) 2 % topical ointment Add nickel size amount to rinse bottle and rinse through both nostrils BID   mupirocin (BACTROBAN) 2 % topical ointment Add nickel size amount to the rinse bottle BID and rinse through both nostrils   PROPRANOLOL HCL (INDERAL PO) Take 60 mg by mouth every morning.   SUMAtriptan succinate (IMITREX) 50 mg tablet Take  by mouth.   trimethoprim (TRIMPEX) 100 mg tablet Take one tablet by mouth at bedtime daily.       Review of Systems/Medical History      Patient summary reviewed  Pertinent labs reviewed    PONV Screening: Non-smoker and Female sex      Airway         TMJ (intermittent popping-no locking in past)      Pulmonary - negative          Cardiovascular - negative        Exercise tolerance: >4 METS (Able to walk 2 blocks/climb 2 flights of stairs and remain asymptomatic)                    GI/Hepatic/Renal       Inflammatory bowel disease (colitis-no recent problems)          GERD (rare-OTC meds resolve if needed), well controlled            Neuro/Psych         Neuromuscular disease (complex regional pain syndrome)      Musculoskeletal         Back pain      Arthritis:  osteo      Fractures (lumbar area-diskectomy)        Endocrine/Other - negative            Constitution - negative       PHYSICAL EXAM     Diagnostic Tests  Hematology: No results found for: HGB, HCT, PLTCT, WBC, NEUT, ANC, LYMPH, ALC, ABSLYMPHCT, MONA, AMC, EOSA, ABC, BASOPHILS, MCV, MCH, MCHC, MPV, RDW      General Chemistry: No results found for: NA, K, CL, CO2, GAP, BUN, CR, GLU, CA, KETONES, ALBUMIN, LACTIC, OBSCA, MG, TOTBILI, TOTBILCB, PO4   Coagulation: No results found for: PT, PTT, INR  [facility] Plan    Interview: Phone Screen Interview                                        Alerts: No

## 2023-02-07 ENCOUNTER — Encounter: Admit: 2023-02-07 | Discharge: 2023-02-07 | Payer: MEDICARE

## 2023-02-08 ENCOUNTER — Encounter: Admit: 2023-02-08 | Discharge: 2023-02-08 | Payer: MEDICARE

## 2023-02-08 ENCOUNTER — Ambulatory Visit: Admit: 2023-02-08 | Discharge: 2023-02-08 | Payer: MEDICARE

## 2023-02-08 DIAGNOSIS — M545 Low back pain: Secondary | ICD-10-CM

## 2023-02-08 DIAGNOSIS — M1990 Unspecified osteoarthritis, unspecified site: Secondary | ICD-10-CM

## 2023-02-08 DIAGNOSIS — R51 Headache: Secondary | ICD-10-CM

## 2023-02-08 DIAGNOSIS — M25552 Pain in left hip: Secondary | ICD-10-CM

## 2023-02-08 DIAGNOSIS — K219 Gastro-esophageal reflux disease without esophagitis: Secondary | ICD-10-CM

## 2023-02-08 DIAGNOSIS — K529 Noninfective gastroenteritis and colitis, unspecified: Secondary | ICD-10-CM

## 2023-02-08 DIAGNOSIS — T148XXA Other injury of unspecified body region, initial encounter: Secondary | ICD-10-CM

## 2023-02-08 DIAGNOSIS — G9050 Complex regional pain syndrome I, unspecified: Secondary | ICD-10-CM

## 2023-02-08 DIAGNOSIS — M7918 Myalgia, other site: Secondary | ICD-10-CM

## 2023-02-08 DIAGNOSIS — M5126 Other intervertebral disc displacement, lumbar region: Secondary | ICD-10-CM

## 2023-02-08 MED ORDER — ARTIFICIAL TEARS (PF) SINGLE DOSE DROPS GROUP
OPHTHALMIC | 0 refills | Status: DC
Start: 2023-02-08 — End: 2023-02-08

## 2023-02-08 MED ORDER — SUGAMMADEX 100 MG/ML IV SOLN
INTRAVENOUS | 0 refills | Status: DC
Start: 2023-02-08 — End: 2023-02-08

## 2023-02-08 MED ORDER — PROPOFOL INJ 10 MG/ML IV VIAL
INTRAVENOUS | 0 refills | Status: DC
Start: 2023-02-08 — End: 2023-02-08

## 2023-02-08 MED ORDER — PHENYLEPHRINE HCL IN 0.9% NACL 1 MG/10 ML (100 MCG/ML) IV SYRG
INTRAVENOUS | 0 refills | Status: DC
Start: 2023-02-08 — End: 2023-02-08

## 2023-02-08 MED ORDER — ROCURONIUM 10 MG/ML IV SOLN
INTRAVENOUS | 0 refills | Status: DC
Start: 2023-02-08 — End: 2023-02-08

## 2023-02-08 MED ORDER — EPHEDRINE SULFATE 50 MG/ML IV SOLN
INTRAVENOUS | 0 refills | Status: DC
Start: 2023-02-08 — End: 2023-02-08

## 2023-02-08 MED ORDER — LIDOCAINE (PF) 200 MG/10 ML (2 %) IJ SYRG
INTRAVENOUS | 0 refills | Status: DC
Start: 2023-02-08 — End: 2023-02-08

## 2023-02-08 MED ORDER — FENTANYL CITRATE (PF) 50 MCG/ML IJ SOLN
INTRAVENOUS | 0 refills | Status: DC
Start: 2023-02-08 — End: 2023-02-08

## 2023-02-08 MED ORDER — DEXAMETHASONE SODIUM PHOSPHATE 4 MG/ML IJ SOLN
INTRAVENOUS | 0 refills | Status: DC
Start: 2023-02-08 — End: 2023-02-08

## 2023-02-08 MED ORDER — CEFAZOLIN 1 GRAM IJ SOLR
INTRAVENOUS | 0 refills | Status: DC
Start: 2023-02-08 — End: 2023-02-08

## 2023-02-08 MED ORDER — ONDANSETRON HCL (PF) 4 MG/2 ML IJ SOLN
INTRAVENOUS | 0 refills | Status: DC
Start: 2023-02-08 — End: 2023-02-08

## 2023-02-08 MED ADMIN — FENTANYL CITRATE (PF) 50 MCG/ML IJ SOLN [3037]: 25 ug | INTRAVENOUS | @ 20:00:00 | Stop: 2023-02-09 | NDC 00409909412

## 2023-02-08 MED ADMIN — LACTATED RINGERS IV SOLP [4318]: 1000 mL | INTRAVENOUS | @ 18:00:00 | Stop: 2023-02-08 | NDC 00338011704

## 2023-02-08 MED ADMIN — THROMBIN (BOVINE) 5,000 UNIT TP SOLR [164515]: 5 mL | TOPICAL | @ 19:00:00 | Stop: 2023-02-08 | NDC 60793031501

## 2023-02-08 MED ADMIN — SODIUM CHLORIDE 0.9 % IJ SOLN [7319]: 5 mL | TOPICAL | @ 19:00:00 | Stop: 2023-02-08 | NDC 00409488820

## 2023-02-08 MED ADMIN — OXYCODONE 5 MG PO TAB [10814]: 10 mg | ORAL | @ 20:00:00 | Stop: 2023-02-08 | NDC 00904696661

## 2023-02-08 MED ADMIN — ACETAMINOPHEN 1,000 MG/100 ML (10 MG/ML) IV SOLN [305632]: 1000 mg | INTRAVENOUS | @ 21:00:00 | Stop: 2023-02-08 | NDC 00264410090

## 2023-02-08 MED FILL — TRAMADOL 50 MG PO TAB: 50 mg | ORAL | 4 days supply | Qty: 12 | Fill #1 | Status: CP

## 2023-02-08 MED FILL — LEVOFLOXACIN 750 MG PO TAB: 750 mg | ORAL | 14 days supply | Qty: 14 | Fill #1 | Status: CP

## 2023-02-10 ENCOUNTER — Encounter: Admit: 2023-02-10 | Discharge: 2023-02-10 | Payer: MEDICARE

## 2023-02-10 DIAGNOSIS — M25552 Pain in left hip: Secondary | ICD-10-CM

## 2023-02-10 DIAGNOSIS — R51 Headache: Secondary | ICD-10-CM

## 2023-02-10 DIAGNOSIS — K219 Gastro-esophageal reflux disease without esophagitis: Secondary | ICD-10-CM

## 2023-02-10 DIAGNOSIS — G9050 Complex regional pain syndrome I, unspecified: Secondary | ICD-10-CM

## 2023-02-10 DIAGNOSIS — M1990 Unspecified osteoarthritis, unspecified site: Secondary | ICD-10-CM

## 2023-02-10 DIAGNOSIS — K529 Noninfective gastroenteritis and colitis, unspecified: Secondary | ICD-10-CM

## 2023-02-10 DIAGNOSIS — M7918 Myalgia, other site: Secondary | ICD-10-CM

## 2023-02-10 DIAGNOSIS — M5126 Other intervertebral disc displacement, lumbar region: Secondary | ICD-10-CM

## 2023-02-10 DIAGNOSIS — M545 Low back pain: Secondary | ICD-10-CM

## 2023-02-10 DIAGNOSIS — T148XXA Other injury of unspecified body region, initial encounter: Secondary | ICD-10-CM

## 2023-02-16 ENCOUNTER — Encounter: Admit: 2023-02-16 | Discharge: 2023-02-16 | Payer: MEDICARE

## 2023-02-16 ENCOUNTER — Ambulatory Visit: Admit: 2023-02-16 | Discharge: 2023-02-16 | Payer: MEDICARE

## 2023-02-16 DIAGNOSIS — R519 Generalized headaches: Secondary | ICD-10-CM

## 2023-02-16 DIAGNOSIS — K529 Noninfective gastroenteritis and colitis, unspecified: Secondary | ICD-10-CM

## 2023-02-16 DIAGNOSIS — G43909 Migraine, unspecified, not intractable, without status migrainosus: Secondary | ICD-10-CM

## 2023-02-16 DIAGNOSIS — M545 Low back pain: Secondary | ICD-10-CM

## 2023-02-16 DIAGNOSIS — T148XXA Other injury of unspecified body region, initial encounter: Secondary | ICD-10-CM

## 2023-02-16 DIAGNOSIS — R51 Headache: Secondary | ICD-10-CM

## 2023-02-16 DIAGNOSIS — M5126 Other intervertebral disc displacement, lumbar region: Secondary | ICD-10-CM

## 2023-02-16 DIAGNOSIS — G9050 Complex regional pain syndrome I, unspecified: Secondary | ICD-10-CM

## 2023-02-16 DIAGNOSIS — M25552 Pain in left hip: Secondary | ICD-10-CM

## 2023-02-16 DIAGNOSIS — M7918 Myalgia, other site: Secondary | ICD-10-CM

## 2023-02-16 DIAGNOSIS — M1990 Unspecified osteoarthritis, unspecified site: Secondary | ICD-10-CM

## 2023-02-16 DIAGNOSIS — K219 Gastro-esophageal reflux disease without esophagitis: Secondary | ICD-10-CM

## 2023-02-16 DIAGNOSIS — C801 Malignant (primary) neoplasm, unspecified: Secondary | ICD-10-CM

## 2023-02-16 DIAGNOSIS — J310 Chronic rhinitis: Secondary | ICD-10-CM

## 2023-02-16 DIAGNOSIS — J320 Chronic maxillary sinusitis: Secondary | ICD-10-CM

## 2023-02-16 MED ORDER — RXAMB AMIKACIN 150 MG CAPSULE (COMPOUND)
ORAL_CAPSULE | ORAL | 3 refills | Status: AC
Start: 2023-02-16 — End: ?

## 2023-02-16 NOTE — Progress Notes
chief complaint  Subjective        HPI:  Tina Landry is a 77 y.o. female who I was asked to see in consultation for evaluation of chronic sinusitis. S/p bilateral mega antrostomy on 2/12.     She was put on Levaquin due to culture with pseudomonas. Doing well. No bleeding. Breathing well. No concerns. Using rinses        The following portions of the patient's history were reviewed and updated as appropriate: allergies, current medications, past family history, past medical history, past social history, past surgical history and problem list.          ROS above reviewed      Current Outpatient Medications:     acetaminophen (TYLENOL) 325 mg tablet, Take one tablet by mouth every 4 hours as needed for Pain., Disp: 50 tablet, Rfl: 0    Aspirin 81 mg Tab, Take one tablet by mouth every morning.  , Disp: , Rfl:     levoFLOXacin (LEVAQUIN) 750 mg tablet, Take one tablet by mouth daily for 14 days., Disp: 14 tablet, Rfl: 0    lidocaine (LIDODERM) 5 % topical patch, 1 PATCH TOPICALLY EVERY 12 HOURS AS NEEDED FOR FOR PAIN, Disp: , Rfl:     MULTIVITAMIN PO, Take 1 Tab by mouth daily., Disp: , Rfl:     mupirocin (BACTROBAN) 2 % topical ointment, Add nickel size amount to rinse bottle and rinse through both nostrils BID, Disp: 30 g, Rfl: 3    mupirocin (BACTROBAN) 2 % topical ointment, Add nickel size amount to the rinse bottle BID and rinse through both nostrils, Disp: 30 g, Rfl: 3    PROPRANOLOL HCL (INDERAL PO), Take 60 mg by mouth every morning., Disp: , Rfl:     SUMAtriptan succinate (IMITREX) 50 mg tablet, Take  by mouth., Disp: , Rfl:     traMADoL (ULTRAM) 50 mg tablet, Take one tablet by mouth every 8 hours as needed for Pain., Disp: 12 tablet, Rfl: 0    trimethoprim (TRIMPEX) 100 mg tablet, Take one tablet by mouth at bedtime daily., Disp: , Rfl:     General    Vitals:    02/16/23 1014   BP: 106/64   Pulse: 65   Temp: 36.3 ?C (97.3 ?F)     General:  Well-developed, well-nourished  Communication and Voice:  Clear pitch and clarity, age appropriate    Head and Face  Inspection:  Normocephalic and atraumatic without masses or lesions  Palpation:  Facial skeleton intact without bony stepoffs, no sinus tenderness  Salivary Glands:  No masses or tenderness  Facial Strength:  Facial motility symmetric and full bilaterally             Left -  House-Brackman Grade 1/6             Right - House-Brackman Grade 1/6    ENT  External nose:  No scar or anatomic deformity  Internal Nose:  Septum intact and midline.  No edema, polyps, or rhinorrhea  Lips, Teeth, and gums:  Mucosa and teeth intact and viable  TMJ:  No pain to palpation with full mobility  Oral cavity/oropharynx:  No erythema or exudate with non-obstructive tonsils  Nasopharynx:  No masses or lesions with intact mucosa  Hypopharynx:  Intact mucosa without pooling of secretions  Larynx:  Full true vocal cord mobility without lesions or masses    Neck  Neck and Trachea:  Midline trachea without mass or lesion  Thyroid:  No  mass or nodularity  Lymphatics:  No lymphadenopathy    Respiratory  Respiratory effort:  Equal inspiration & expiration without use of accessory muscles. No stridor    Cardiovascular  Peripheral Vascular:  Warm extremities with equal pulses    Eyes  Nystagmus: None  EOM: Equal extraocular motion bilaterally    Neuro/Psych/Balance  Orientation: Patient oriented to person, place, and time  Affect: Appropriate mood and affect  Gait: Intact with no imbalance  Cranial nerves: II-XII are intact    Ear  External canal: Left - Canal is patent with intact skin                             Right - Canal is patent with intact skin  Tympanic Membranes: Left - Clear and mobile                                            Right - Clear and mobile  Middle Ears: Left - Aerated, no effusion, no masses                          Right - Aerated, no effusion, no masses      Office Procedure    Nasal Endoscopy    Indications: Was performed due to chronic rhinosinusitis    After topical anesthesia and decongestion had been obtained using aerosolized 1% lidocaine and oxymetazoline, a 30 degree rigid endoscope was placed into both nares with the patient in a sitting position. The following was observed:    Right Nasal Cavity and Paranasal Sinuses:  Widely patent mega antrostomy and ethmoid, some dry blood floor along the maxillary floor    Interior of the nasal cavity: No masses or Purulence  Middle Meatus: no mass or purulence  Inferior turbinate: moderate hypertrophy  Middle Turbinate: no masses  Superior turbinate: no masses  Superior meatus: no masses  Sphenoethmoid recess: no purulence or polyps         Left Nasal Cavity and Paranasal Sinuses: Widely patent mega antrostomy and ethmoid, some dry blood floor along the maxillary floor    Interior of the nasal cavity: No masses or Purulence  Middle Meatus: no mass or purulence  Inferior turbinate: moderate hypertrophy  Middle Turbinate: no masses  Superior turbinate: no masses  Superior meatus: no masses  Sphenoethmoid recess: no purulence or polyps      Septum: deviated  Other:    The patient tolerated the procedure well.                Impression/Plan:    Thank you for allowing me to see your patient in consultation.  Based on my exam and review of the patients records, the following is my impression and plan:    1.Chronic rhinosinusitis    2. Chronic rhinitis    She is doing very well after bilateral mega antrostomy. No sign of infection today. She should continue with her levaquin to end up 2/26. We will start her on Amikacin rinses BID for 1 month. Follow up in 3 weeks.     F/u:     Heloise Purpura, M.D.    Professor and Villa Herb, M.D. Chairman  Department of Otolaryngology-Head and Neck Surgery  [employer] [facility] [employer] [facility] [employer]

## 2023-02-16 NOTE — Progress Notes
Date of Service: 02/16/2023    Subjective:             Tina Landry "Tina Landry" Meddings is a 77 y.o. female.    History of Present Illness       Review of Systems   Constitutional: Negative.    HENT: Negative.     Eyes: Negative.    Respiratory: Negative.     Cardiovascular: Negative.    Gastrointestinal: Negative.    Endocrine: Negative.    Genitourinary: Negative.    Musculoskeletal: Negative.    Skin: Negative.    Allergic/Immunologic: Negative.    Neurological: Negative.    Hematological: Negative.    Psychiatric/Behavioral: Negative.         Objective:          acetaminophen (TYLENOL) 325 mg tablet Take one tablet by mouth every 4 hours as needed for Pain.    Aspirin 81 mg Tab Take one tablet by mouth every morning.      levoFLOXacin (LEVAQUIN) 750 mg tablet Take one tablet by mouth daily for 14 days.    lidocaine (LIDODERM) 5 % topical patch 1 PATCH TOPICALLY EVERY 12 HOURS AS NEEDED FOR FOR PAIN    MULTIVITAMIN PO Take 1 Tab by mouth daily.    mupirocin (BACTROBAN) 2 % topical ointment Add nickel size amount to rinse bottle and rinse through both nostrils BID    mupirocin (BACTROBAN) 2 % topical ointment Add nickel size amount to the rinse bottle BID and rinse through both nostrils    PROPRANOLOL HCL (INDERAL PO) Take 60 mg by mouth every morning.    SUMAtriptan succinate (IMITREX) 50 mg tablet Take  by mouth.    traMADoL (ULTRAM) 50 mg tablet Take one tablet by mouth every 8 hours as needed for Pain.    trimethoprim (TRIMPEX) 100 mg tablet Take one tablet by mouth at bedtime daily.     Vitals:    02/16/23 1014   BP: 106/64   BP Source: Arm, Right Upper   Pulse: 65   Temp: 36.3 C (97.3 F)   PainSc: Zero   Weight: 65.8 kg (145 lb)   Height: 160 cm (5' 2.99")     Body mass index is 25.69 kg/m.     Physical Exam         Assessment and Plan:

## 2023-03-09 ENCOUNTER — Encounter: Admit: 2023-03-09 | Discharge: 2023-03-09 | Payer: MEDICARE

## 2023-03-16 ENCOUNTER — Ambulatory Visit: Admit: 2023-03-16 | Discharge: 2023-03-16 | Payer: MEDICARE

## 2023-03-16 ENCOUNTER — Encounter: Admit: 2023-03-16 | Discharge: 2023-03-16 | Payer: MEDICARE

## 2023-03-16 DIAGNOSIS — T148XXA Other injury of unspecified body region, initial encounter: Secondary | ICD-10-CM

## 2023-03-16 DIAGNOSIS — K219 Gastro-esophageal reflux disease without esophagitis: Secondary | ICD-10-CM

## 2023-03-16 DIAGNOSIS — M545 Low back pain: Secondary | ICD-10-CM

## 2023-03-16 DIAGNOSIS — M5126 Other intervertebral disc displacement, lumbar region: Secondary | ICD-10-CM

## 2023-03-16 DIAGNOSIS — G9050 Complex regional pain syndrome I, unspecified: Secondary | ICD-10-CM

## 2023-03-16 DIAGNOSIS — J320 Chronic maxillary sinusitis: Secondary | ICD-10-CM

## 2023-03-16 DIAGNOSIS — R519 Generalized headaches: Secondary | ICD-10-CM

## 2023-03-16 DIAGNOSIS — K529 Noninfective gastroenteritis and colitis, unspecified: Secondary | ICD-10-CM

## 2023-03-16 DIAGNOSIS — J324 Chronic pansinusitis: Secondary | ICD-10-CM

## 2023-03-16 DIAGNOSIS — C801 Malignant (primary) neoplasm, unspecified: Secondary | ICD-10-CM

## 2023-03-16 DIAGNOSIS — R51 Headache: Secondary | ICD-10-CM

## 2023-03-16 DIAGNOSIS — G43909 Migraine, unspecified, not intractable, without status migrainosus: Secondary | ICD-10-CM

## 2023-03-16 DIAGNOSIS — M25552 Pain in left hip: Secondary | ICD-10-CM

## 2023-03-16 DIAGNOSIS — M1990 Unspecified osteoarthritis, unspecified site: Secondary | ICD-10-CM

## 2023-03-16 DIAGNOSIS — M7918 Myalgia, other site: Secondary | ICD-10-CM

## 2023-03-16 DIAGNOSIS — J310 Chronic rhinitis: Secondary | ICD-10-CM

## 2023-03-16 NOTE — Progress Notes
chief complaint  Subjective        HPI:  Tina Landry is a 77 y.o. female who I was asked to see in consultation for evaluation of chronic sinusitis. She had a bilateral mega antrostomy on 2/12.  Her last clinic visit was 02/16/23 and she was prescribed Levaquin and Amikacin. She has 4 more days of Amikacin. She denies any issues at this time and reports this is the first time in a while that she has been without crusting.       The following portions of the patient's history were reviewed and updated as appropriate: allergies, current medications, past family history, past medical history, past social history, past surgical history and problem list.          ROS above reviewed      Current Outpatient Medications:     acetaminophen (TYLENOL) 325 mg tablet, Take one tablet by mouth every 4 hours as needed for Pain., Disp: 50 tablet, Rfl: 0    amikacin 150 mg capsule (COMPOUND), Open Compound amikacin sulfate 150mg  capsule and add contents to sinus rinse twice daily., Disp: 60 capsule, Rfl: 3    Aspirin 81 mg Tab, Take one tablet by mouth every morning.  , Disp: , Rfl:     lidocaine (LIDODERM) 5 % topical patch, 1 PATCH TOPICALLY EVERY 12 HOURS AS NEEDED FOR FOR PAIN, Disp: , Rfl:     MULTIVITAMIN PO, Take 1 Tab by mouth daily., Disp: , Rfl:     mupirocin (BACTROBAN) 2 % topical ointment, Add nickel size amount to rinse bottle and rinse through both nostrils BID, Disp: 30 g, Rfl: 3    mupirocin (BACTROBAN) 2 % topical ointment, Add nickel size amount to the rinse bottle BID and rinse through both nostrils, Disp: 30 g, Rfl: 3    PROPRANOLOL HCL (INDERAL PO), Take 60 mg by mouth every morning., Disp: , Rfl:     SUMAtriptan succinate (IMITREX) 50 mg tablet, Take  by mouth., Disp: , Rfl:     traMADoL (ULTRAM) 50 mg tablet, Take one tablet by mouth every 8 hours as needed for Pain., Disp: 12 tablet, Rfl: 0    trimethoprim (TRIMPEX) 100 mg tablet, Take one tablet by mouth at bedtime daily., Disp: , Rfl:     General    Vitals: 03/16/23 1434   BP: 121/65   Pulse: 62   Temp: 36.3 ?C (97.3 ?F)     General:  Well-developed, well-nourished  Communication and Voice:  Clear pitch and clarity, age appropriate    Head and Face  Inspection:  Normocephalic and atraumatic without masses or lesions  Palpation:  Facial skeleton intact without bony stepoffs, no sinus tenderness  Salivary Glands:  No masses or tenderness  Facial Strength:  Facial motility symmetric and full bilaterally             Left -  House-Brackman Grade 1/6             Right - House-Brackman Grade 1/6    ENT  External nose:  No scar or anatomic deformity  Internal Nose:  Septum intact and midline.  No edema, polyps, or rhinorrhea  Lips, Teeth, and gums:  Mucosa and teeth intact and viable  TMJ:  No pain to palpation with full mobility  Oral cavity/oropharynx:  No erythema or exudate with non-obstructive tonsils  Nasopharynx:  No masses or lesions with intact mucosa  Hypopharynx:  Intact mucosa without pooling of secretions  Larynx:  Full true vocal cord mobility  without lesions or masses    Neck  Neck and Trachea:  Midline trachea without mass or lesion  Thyroid:  No mass or nodularity  Lymphatics:  No lymphadenopathy    Respiratory  Respiratory effort:  Equal inspiration & expiration without use of accessory muscles. No stridor    Cardiovascular  Peripheral Vascular:  Warm extremities with equal pulses    Eyes  Nystagmus: None  EOM: Equal extraocular motion bilaterally    Neuro/Psych/Balance  Orientation: Patient oriented to person, place, and time  Affect: Appropriate mood and affect  Gait: Intact with no imbalance  Cranial nerves: II-XII are intact    Ear  External canal: Left - Canal is patent with intact skin                             Right - Canal is patent with intact skin  Tympanic Membranes: Left - Clear and mobile                                            Right - Clear and mobile  Middle Ears: Left - Aerated, no effusion, no masses                          Right - Aerated, no effusion, no masses      Office Procedure    Nasal Endoscopy    Indications: Was performed due to chronic rhinosinusitis    After topical anesthesia and decongestion had been obtained using aerosolized 1% lidocaine and oxymetazoline, a 30 degree rigid endoscope was placed into both nares with the patient in a sitting position. The following was observed:    Right Nasal Cavity and Paranasal Sinuses:     Interior of the nasal cavity: No masses or Purulence  Middle Meatus: no mass or purulence  Inferior turbinate: partially resected  Middle Turbinate: no masses  Superior turbinate: no masses  Superior meatus: no masses  Sphenoethmoid recess: no purulence or polyps         Left Nasal Cavity and Paranasal Sinuses:     Interior of the nasal cavity: No masses or Purulence  Middle Meatus: no mass or purulence  Inferior turbinate: partially resected  Middle Turbinate: no masses  Superior turbinate: no masses  Superior meatus: no masses  Sphenoethmoid recess: no purulence or polyps      Septum: midline  Other:    The patient tolerated the procedure well.                Impression/Plan:    Thank you for allowing me to see your patient in consultation.  Based on my exam and review of the patients records, the following is my impression and plan:    1.Chronic rhinosinusitis    2. Chronic rhinitis    She is doing well after her revision bilateral mega antrostomy. No signs of infection. Recommend finishing course of Amikacin and then moving to saline irrigations.       F/u: prn    Heloise Purpura, M.D.    Professor and Villa Herb, M.D. Chairman  Department of Otolaryngology-Head and Neck Surgery  [employer] [facility] [employer]

## 2023-03-16 NOTE — Progress Notes
Date of Service: 03/16/2023    Subjective:             Tina Landry is a 77 y.o. female.    History of Present Illness       Review of Systems   Constitutional: Negative.    HENT: Negative.     Eyes: Negative.    Respiratory: Negative.     Cardiovascular: Negative.    Gastrointestinal: Negative.    Endocrine: Negative.    Genitourinary: Negative.    Musculoskeletal: Negative.    Skin: Negative.    Allergic/Immunologic: Negative.    Neurological: Negative.    Hematological: Negative.    Psychiatric/Behavioral: Negative.         Objective:          acetaminophen (TYLENOL) 325 mg tablet Take one tablet by mouth every 4 hours as needed for Pain.    amikacin 150 mg capsule (COMPOUND) Open Compound amikacin sulfate 150mg  capsule and add contents to sinus rinse twice daily.    Aspirin 81 mg Tab Take one tablet by mouth every morning.      lidocaine (LIDODERM) 5 % topical patch 1 PATCH TOPICALLY EVERY 12 HOURS AS NEEDED FOR FOR PAIN    MULTIVITAMIN PO Take 1 Tab by mouth daily.    mupirocin (BACTROBAN) 2 % topical ointment Add nickel size amount to rinse bottle and rinse through both nostrils BID    mupirocin (BACTROBAN) 2 % topical ointment Add nickel size amount to the rinse bottle BID and rinse through both nostrils    PROPRANOLOL HCL (INDERAL PO) Take 60 mg by mouth every morning.    SUMAtriptan succinate (IMITREX) 50 mg tablet Take  by mouth.    traMADoL (ULTRAM) 50 mg tablet Take one tablet by mouth every 8 hours as needed for Pain.    trimethoprim (TRIMPEX) 100 mg tablet Take one tablet by mouth at bedtime daily.     Vitals:    03/16/23 1434   BP: 121/65   BP Source: Arm, Left Upper   Pulse: 62   Temp: 36.3 C (97.3 F)   PainSc: Zero   Weight: 65.8 kg (145 lb)   Height: 160 cm (5' 2.99")     Body mass index is 25.69 kg/m.     Physical Exam         Assessment and Plan:

## 2023-08-02 IMAGING — MR Sacrum^Routine
4 of 7 series · 25 of 48 positions shown · non-contrast
Comparison: none

[Series 3: T2 fat-sat · axial · 8.0mm · 1.09mm/px · z∈[-144,+19]mm · 7 of 18 slices shown]
[im 1/18]
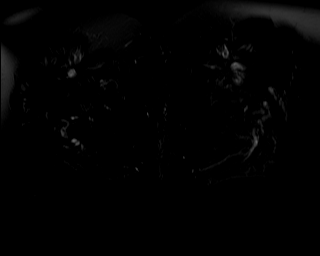
[im 3/18]
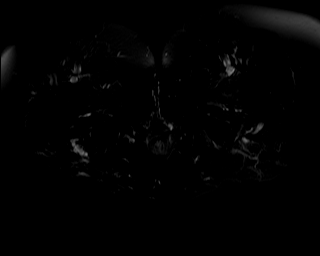
[im 6/18]
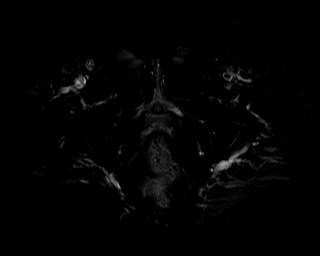
[im 9/18]
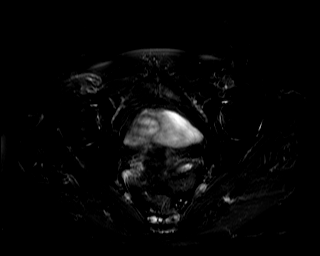
[im 12/18]
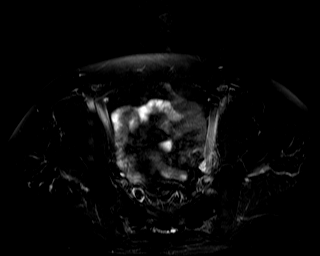
[im 15/18]
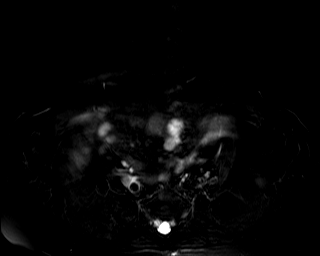
[im 18/18]
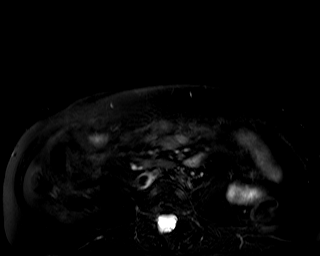

[Series 4: T1 · axial · 8.0mm · 0.68mm/px · z∈[-153,+19]mm · 7 of 19 slices shown (1 of 2)]
[im 1/19]
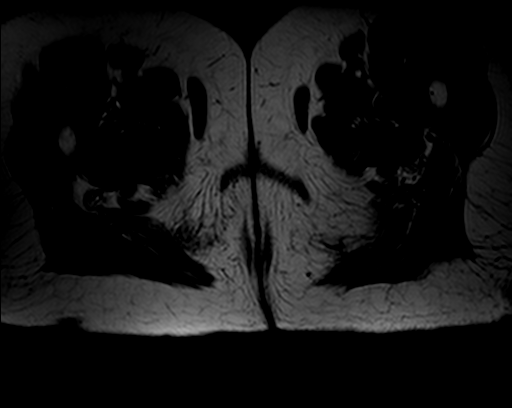
[im 4/19]
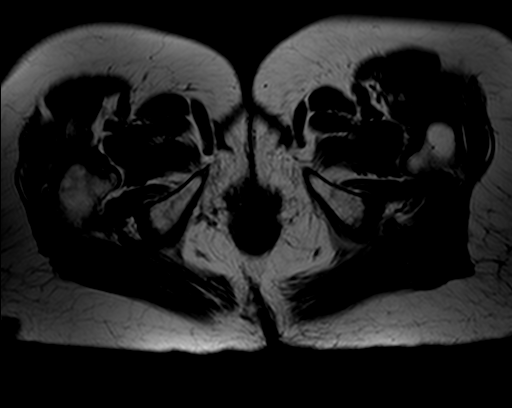
[im 7/19]
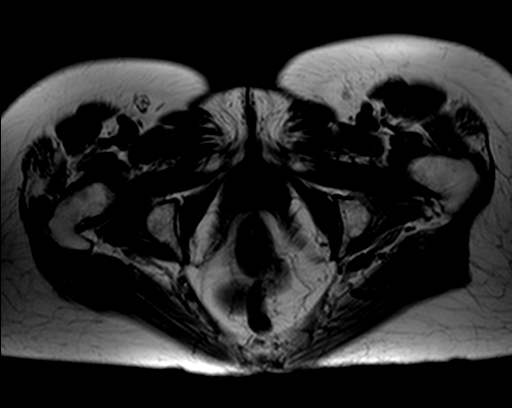
[im 10/19]
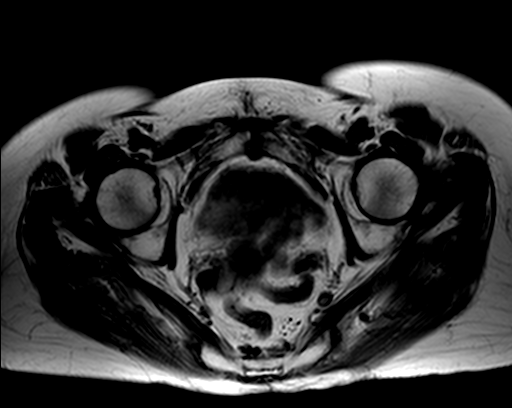
[im 13/19]
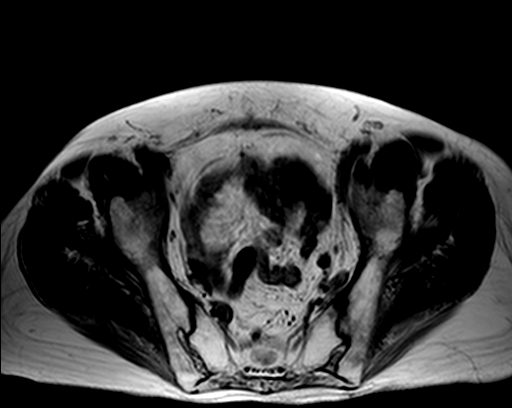
[im 16/19]
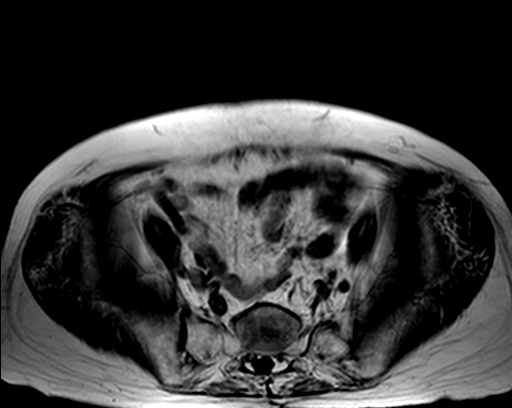
[im 19/19]
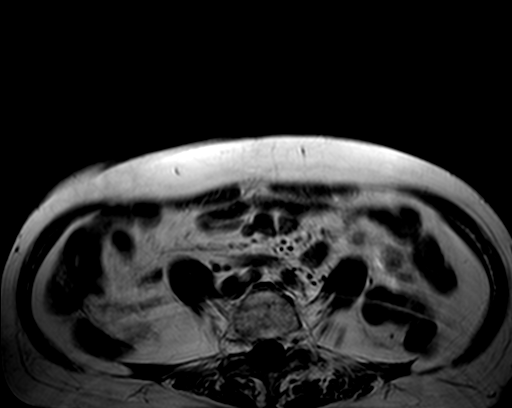

[Series 5: T1 · coronal · 8.0mm · 0.74mm/px · 7 of 19 slices shown (2 of 2)]
[im 1/19]
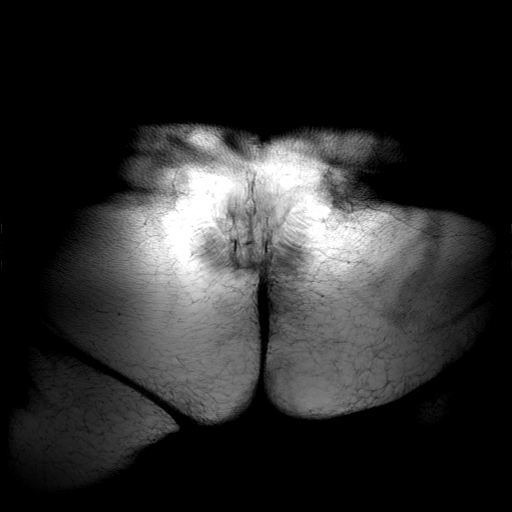
[im 4/19]
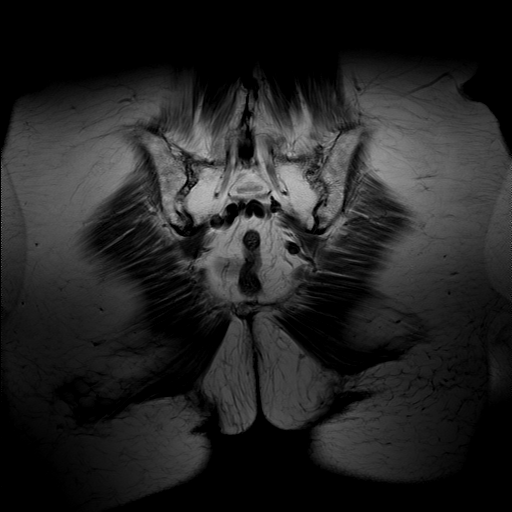
[im 7/19]
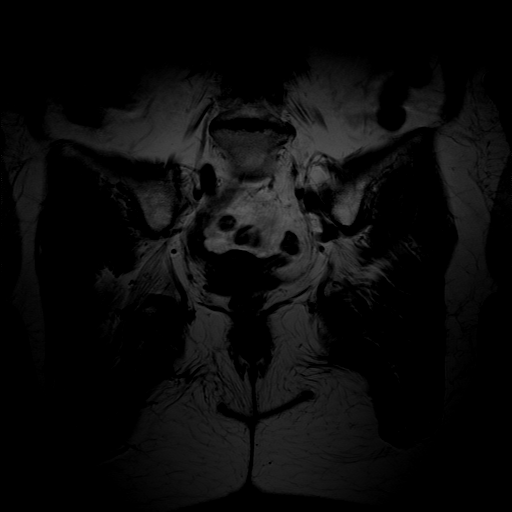
[im 10/19]
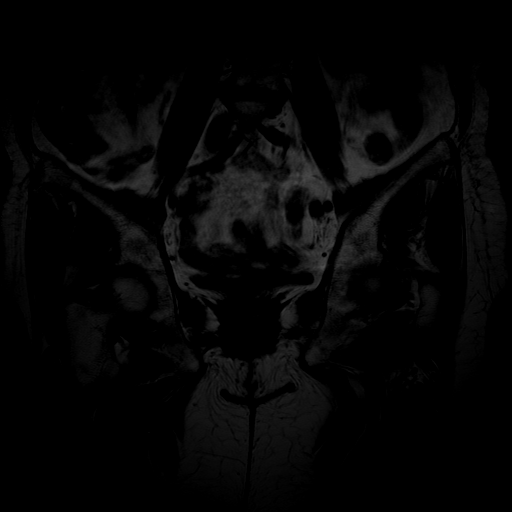
[im 13/19]
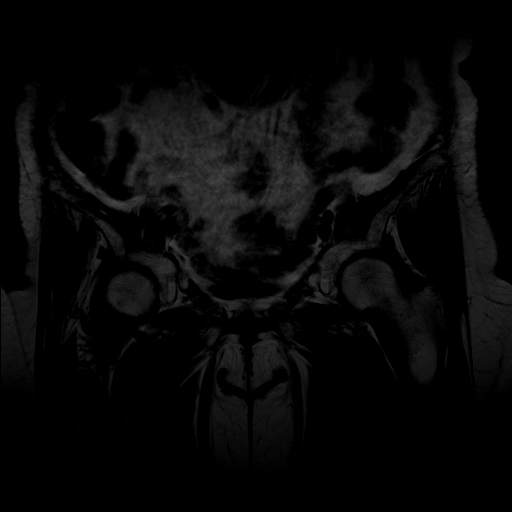
[im 16/19]
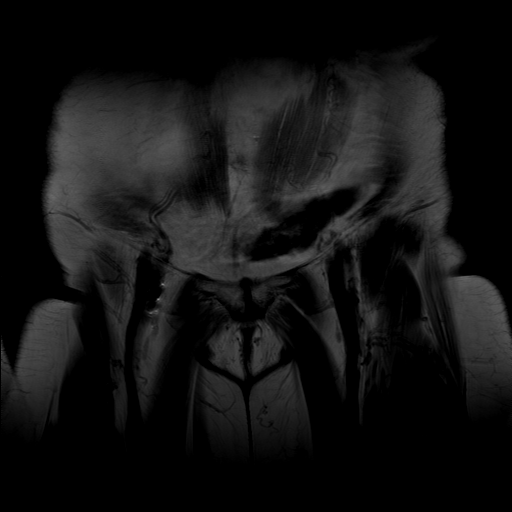
[im 19/19]
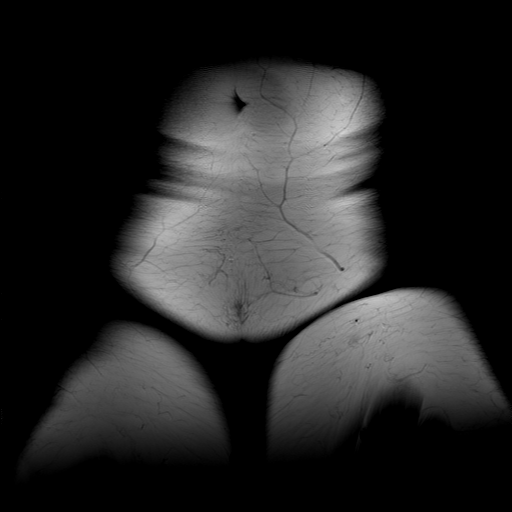

[Series 6: t1_tirm_cor · coronal · 8.0mm · 0.74mm/px · 4 of 19 slices shown]
[im 1/19]
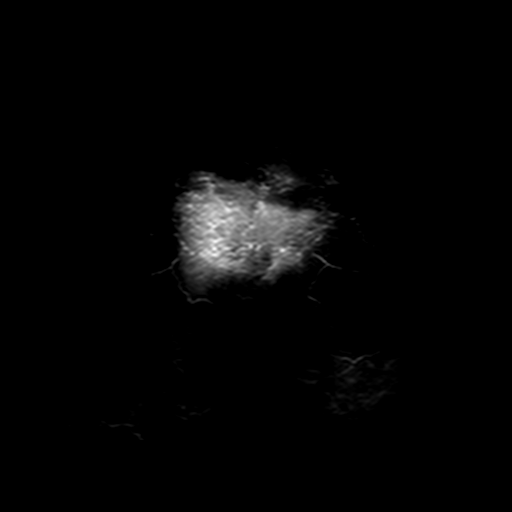
[im 4/19]
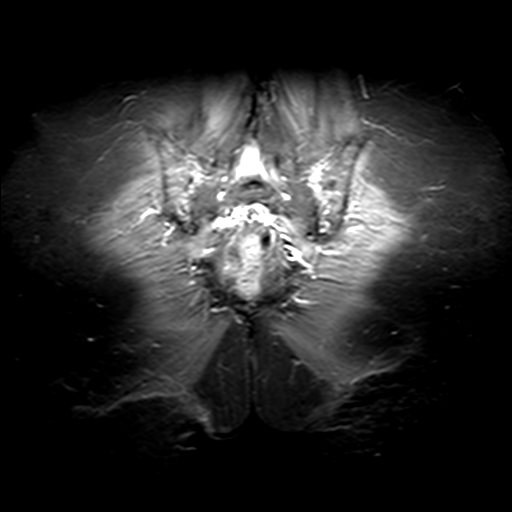
[im 10/19]
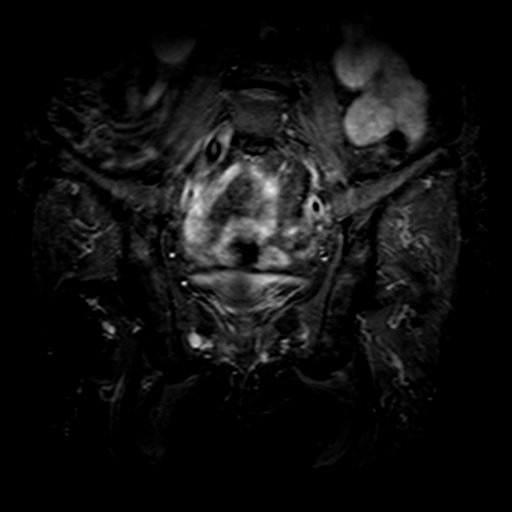
[im 16/19]
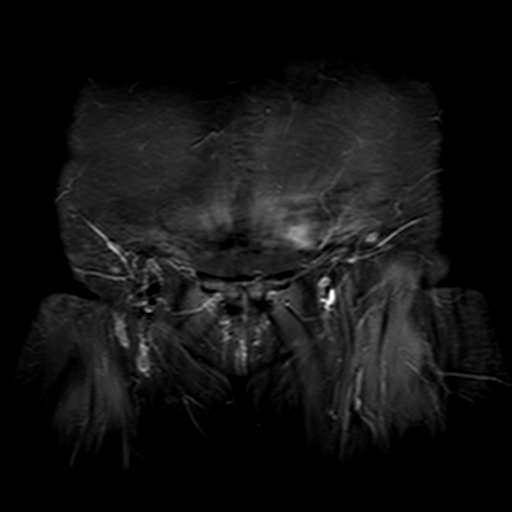

[25 of 48 positions shown; findings below may reference images not displayed]

Ordering:    SONYLESMANA, ALAIKA PUTRY MAHDA

DIAGNOSTIC STUDIES

EXAM

MRI of the pelvis without contrast

INDICATION

Left buttock pain
LEFT SIDED BUTTOCKS SINCE RT SIDED L SPINE SURGERY SEVERAL YEARS AGO. WORSE WITH LAYING AND
SITTING.  RG

TECHNIQUE

Sagittal, axial, and coronal images were obtained with variable T1 and T2 weighting.

COMPARISONS

None available

FINDINGS

Uterus is retroverted. Visualized osseous structures demonstrate normal signal intensity. There are
small cystic foci in the presacral space image 14 series 9. These are unchanged from prior CT
abdomen and pelvis September 24, 2021 and likely represent small lympho venous malformations.

Soft tissues demonstrate normal signal intensity. There is no evidence for abnormal mass or fluid
collection in the left gluteal region.

There is moderate loss of articular cartilage of both hips.

IMPRESSION

Moderate degenerative changes as described. No fractures are seen.

No inflammatory process is evident throughout the soft tissues of the pelvis. Uterus is
retroverted.

Tech Notes:

LEFT SIDED BUTTOCKS SINCE RT SIDED L SPINE SURGERY SEVERAL YEARS AGO. WORSE WITH LAYING AND SITTING.
RG

## 2023-08-09 ENCOUNTER — Ambulatory Visit: Admit: 2023-08-09 | Discharge: 2023-08-09 | Payer: MEDICARE

## 2023-08-09 ENCOUNTER — Encounter: Admit: 2023-08-09 | Discharge: 2023-08-09 | Payer: MEDICARE

## 2023-08-09 DIAGNOSIS — J320 Chronic maxillary sinusitis: Secondary | ICD-10-CM

## 2023-08-09 MED ORDER — AMOXICILLIN-POT CLAVULANATE 875-125 MG PO TAB
1 | ORAL_TABLET | Freq: Two times a day (BID) | ORAL | 0 refills | 7.00000 days | Status: AC
Start: 2023-08-09 — End: ?

## 2023-08-09 NOTE — Patient Instructions
Please take the antibiotic (Augmentin - amoxicillin/clavunate) as prescribed. Follow up with your ENT as scheduled and please call his office if you start having any worsening symptoms.

## 2023-08-09 NOTE — Progress Notes
Telehealth Visit Note    Date of Service: 08/09/2023    Subjective:           Tina Landry is a 77 y.o. female.  Chief Complaint   Patient presents with    Other     Yellow, grey and green drainage from my sinuses. Surgery done in feb.          History of Present Illness  Tina Landry presents for a virtual urgent care visit for the above concern.  She has had 3 sinus surgeries with the most recent being done in Feb 2024.   Since May she has had increased drainage that does not come out of her nose but with a cough or clearing her throat. The drainage has been getting worse, increased in amount and color (gray, yellow, green) with a sore throat with a bad taste in her mouth, left side of her face tender and her teeth hurt. She denies any fever.   She has been doing sinus irrigation twice a day.  She has an appointment with Dr. Rhona Leavens on 08/24/2023.                Objective:          acetaminophen (TYLENOL) 325 mg tablet Take one tablet by mouth every 4 hours as needed for Pain.    amikacin 150 mg capsule (COMPOUND) Open Compound amikacin sulfate 150mg  capsule and add contents to sinus rinse twice daily.    amoxicillin-potassium clavulanate (AUGMENTIN) 875/125 mg tablet Take one tablet by mouth twice daily with meals for 7 days.    Aspirin 81 mg Tab Take one tablet by mouth every morning.      lidocaine (LIDODERM) 5 % topical patch 1 PATCH TOPICALLY EVERY 12 HOURS AS NEEDED FOR FOR PAIN    MULTIVITAMIN PO Take 1 Tab by mouth daily.    mupirocin (BACTROBAN) 2 % topical ointment Add nickel size amount to rinse bottle and rinse through both nostrils BID    mupirocin (BACTROBAN) 2 % topical ointment Add nickel size amount to the rinse bottle BID and rinse through both nostrils    PROPRANOLOL HCL (INDERAL PO) Take 60 mg by mouth every morning.    SUMAtriptan succinate (IMITREX) 50 mg tablet Take  by mouth.    traMADoL (ULTRAM) 50 mg tablet Take one tablet by mouth every 8 hours as needed for Pain. trimethoprim (TRIMPEX) 100 mg tablet Take one tablet by mouth at bedtime daily.           Computed Telehealth Body Mass Index unavailable. One or more values for this score either were not found within the given timeframe or did not fit some other criterion.    Physical Exam  Constitutional:       General: She is not in acute distress.     Appearance: She is not ill-appearing.   HENT:      Nose:      Comments: I had the patient palpate her sinus regions and she states the left maxillary area is tender. Exam limited due to telehealth visit.       Pulmonary:      Effort: Pulmonary effort is normal. No respiratory distress.   Skin:     Comments: Appears to be warm and dry with good color.    Neurological:      Mental Status: She is alert.              Assessment and Plan:  Tina  P. Hartig Kista Landry was seen today for other.    Diagnoses and all orders for this visit:    Chronic maxillary sinusitis  -     amoxicillin-potassium clavulanate (AUGMENTIN) 875/125 mg tablet; Take one tablet by mouth twice daily with meals for 7 days.      Patient Instructions   Please take the antibiotic (Augmentin - amoxicillin/clavunate) as prescribed. Follow up with your ENT as scheduled and please call his office if you start having any worsening symptoms.

## 2023-08-24 ENCOUNTER — Encounter: Admit: 2023-08-24 | Discharge: 2023-08-24 | Payer: MEDICARE

## 2023-08-24 ENCOUNTER — Ambulatory Visit: Admit: 2023-08-24 | Discharge: 2023-08-24 | Payer: MEDICARE

## 2023-08-24 DIAGNOSIS — M1990 Unspecified osteoarthritis, unspecified site: Secondary | ICD-10-CM

## 2023-08-24 DIAGNOSIS — M25552 Pain in left hip: Secondary | ICD-10-CM

## 2023-08-24 DIAGNOSIS — K219 Gastro-esophageal reflux disease without esophagitis: Secondary | ICD-10-CM

## 2023-08-24 DIAGNOSIS — T148XXA Other injury of unspecified body region, initial encounter: Secondary | ICD-10-CM

## 2023-08-24 DIAGNOSIS — M7918 Myalgia, other site: Secondary | ICD-10-CM

## 2023-08-24 DIAGNOSIS — G9050 Complex regional pain syndrome I, unspecified: Secondary | ICD-10-CM

## 2023-08-24 DIAGNOSIS — M5126 Other intervertebral disc displacement, lumbar region: Secondary | ICD-10-CM

## 2023-08-24 DIAGNOSIS — G43909 Migraine, unspecified, not intractable, without status migrainosus: Secondary | ICD-10-CM

## 2023-08-24 DIAGNOSIS — M545 Low back pain: Secondary | ICD-10-CM

## 2023-08-24 DIAGNOSIS — R519 Generalized headaches: Secondary | ICD-10-CM

## 2023-08-24 DIAGNOSIS — K529 Noninfective gastroenteritis and colitis, unspecified: Secondary | ICD-10-CM

## 2023-08-24 DIAGNOSIS — R51 Headache: Secondary | ICD-10-CM

## 2023-08-24 DIAGNOSIS — C801 Malignant (primary) neoplasm, unspecified: Secondary | ICD-10-CM

## 2023-08-24 DIAGNOSIS — J320 Chronic maxillary sinusitis: Secondary | ICD-10-CM

## 2023-08-24 DIAGNOSIS — J310 Chronic rhinitis: Secondary | ICD-10-CM

## 2023-08-24 NOTE — Progress Notes
chief complaint  Subjective        HPI:  Tina Landry is a 77 y.o. female who I was asked to see in consultation for evaluation of chronic sinusitis. S/p bil mega antrostomies for persistent sinusitis.  Had been doing well until June when she started to have purulent draiange and post nasal draiange.  Has had pseudomonas and staph grow out in the past. Has just been doing saline irrigaitons.      The following portions of the patient's history were reviewed and updated as appropriate: allergies, current medications, past family history, past medical history, past social history, past surgical history and problem list.          ROS above reviewed      Current Outpatient Medications:     acetaminophen (TYLENOL) 325 mg tablet, Take one tablet by mouth every 4 hours as needed for Pain., Disp: 50 tablet, Rfl: 0    amikacin 150 mg capsule (COMPOUND), Open Compound amikacin sulfate 150mg  capsule and add contents to sinus rinse twice daily., Disp: 60 capsule, Rfl: 3    Aspirin 81 mg Tab, Take one tablet by mouth every morning.  , Disp: , Rfl:     lidocaine (LIDODERM) 5 % topical patch, 1 PATCH TOPICALLY EVERY 12 HOURS AS NEEDED FOR FOR PAIN, Disp: , Rfl:     MULTIVITAMIN PO, Take 1 Tab by mouth daily., Disp: , Rfl:     mupirocin (BACTROBAN) 2 % topical ointment, Add nickel size amount to rinse bottle and rinse through both nostrils BID, Disp: 30 g, Rfl: 3    mupirocin (BACTROBAN) 2 % topical ointment, Add nickel size amount to the rinse bottle BID and rinse through both nostrils, Disp: 30 g, Rfl: 3    PROPRANOLOL HCL (INDERAL PO), Take 60 mg by mouth every morning., Disp: , Rfl:     SUMAtriptan succinate (IMITREX) 50 mg tablet, Take  by mouth., Disp: , Rfl:     traMADoL (ULTRAM) 50 mg tablet, Take one tablet by mouth every 8 hours as needed for Pain., Disp: 12 tablet, Rfl: 0    trimethoprim (TRIMPEX) 100 mg tablet, Take one tablet by mouth at bedtime daily., Disp: , Rfl:     General    Vitals:    08/24/23 1334   BP: 106/67 Pulse: 75   Temp: 36.8 ?C (98.3 ?F)     General:  Well-developed, well-nourished  Communication and Voice:  Clear pitch and clarity, age appropriate    Head and Face  Inspection:  Normocephalic and atraumatic without masses or lesions  Palpation:  Facial skeleton intact without bony stepoffs, no sinus tenderness  Salivary Glands:  No masses or tenderness  Facial Strength:  Facial motility symmetric and full bilaterally             Left -  House-Brackman Grade 1/6             Right - House-Brackman Grade 1/6    ENT  External nose:  No scar or anatomic deformity  Internal Nose:  Septum intact and midline.  No edema, polyps, or rhinorrhea  Lips, Teeth, and gums:  Mucosa and teeth intact and viable  TMJ:  No pain to palpation with full mobility  Oral cavity/oropharynx:  No erythema or exudate with non-obstructive tonsils  Nasopharynx:  No masses or lesions with intact mucosa  Hypopharynx:  Intact mucosa without pooling of secretions  Larynx:  Full true vocal cord mobility without lesions or masses    Neck  Neck  and Trachea:  Midline trachea without mass or lesion  Thyroid:  No mass or nodularity  Lymphatics:  No lymphadenopathy    Respiratory  Respiratory effort:  Equal inspiration & expiration without use of accessory muscles. No stridor    Cardiovascular  Peripheral Vascular:  Warm extremities with equal pulses    Eyes  Nystagmus: None  EOM: Equal extraocular motion bilaterally    Neuro/Psych/Balance  Orientation: Patient oriented to person, place, and time  Affect: Appropriate mood and affect  Gait: Intact with no imbalance  Cranial nerves: II-XII are intact    Ear  External canal: Left - Canal is patent with intact skin                             Right - Canal is patent with intact skin  Tympanic Membranes: Left - Clear and mobile                                            Right - Clear and mobile  Middle Ears: Left - Aerated, no effusion, no masses                          Right - Aerated, no effusion, no masses      Office Procedure    Nasal Endoscopy    Indications: Was performed due to chronic rhinosinusitis    After topical anesthesia and decongestion had been obtained using aerosolized 1% lidocaine and oxymetazoline, a 30 degree rigid endoscope was placed into both nares with the patient in a sitting position. The following was observed:    Right Nasal Cavity and Paranasal Sinuses:     Interior of the nasal cavity: mild purulence - cultured with polypoid edema along floor of sinus  Middle Meatus: no mass or purulence  Inferior turbinate: moderate hypertrophy  Middle Turbinate: no masses  Superior turbinate: no masses  Superior meatus: no masses  Sphenoethmoid recess: no purulence or polyps         Left Nasal Cavity and Paranasal Sinuses:     Interior of the nasal cavity: mild purulence with mild polypoid edema along the floor of the max sinus  Middle Meatus: no mass or purulence  Inferior turbinate: moderate hypertrophy  Middle Turbinate: no masses  Superior turbinate: no masses  Superior meatus: no masses  Sphenoethmoid recess: no purulence or polyps      Septum: midline  Other:    The patient tolerated the procedure well.                Impression/Plan:    Thank you for allowing me to see your patient in consultation.  Based on my exam and review of the patients records, the following is my impression and plan:    1.Chronic rhinosinusitis    2. Chronic rhinitis      Cx taken - mild edema along the bottom of the sinus with mild mucppiurilence - cultured.  Will await cx results and then try topical antibiotics first.  F/u after course of topical abx    F/u:     Heloise Purpura, M.D.    Professor and Villa Herb, M.D. Chairman  Department of Otolaryngology-Head and Neck Surgery  [employer] [facility] [employer] [facility]

## 2023-08-31 ENCOUNTER — Encounter: Admit: 2023-08-31 | Discharge: 2023-08-31 | Payer: MEDICARE

## 2023-09-19 ENCOUNTER — Encounter: Admit: 2023-09-19 | Discharge: 2023-09-19 | Payer: MEDICARE

## 2023-09-21 ENCOUNTER — Encounter: Admit: 2023-09-21 | Discharge: 2023-09-21 | Payer: MEDICARE

## 2023-09-21 ENCOUNTER — Ambulatory Visit: Admit: 2023-09-21 | Discharge: 2023-09-21 | Payer: MEDICARE

## 2023-09-21 DIAGNOSIS — R519 Generalized headaches: Secondary | ICD-10-CM

## 2023-09-21 DIAGNOSIS — J320 Chronic maxillary sinusitis: Secondary | ICD-10-CM

## 2023-09-21 DIAGNOSIS — M5126 Other intervertebral disc displacement, lumbar region: Secondary | ICD-10-CM

## 2023-09-21 DIAGNOSIS — G43909 Migraine, unspecified, not intractable, without status migrainosus: Secondary | ICD-10-CM

## 2023-09-21 DIAGNOSIS — T148XXA Other injury of unspecified body region, initial encounter: Secondary | ICD-10-CM

## 2023-09-21 DIAGNOSIS — J310 Chronic rhinitis: Secondary | ICD-10-CM

## 2023-09-21 DIAGNOSIS — K529 Noninfective gastroenteritis and colitis, unspecified: Secondary | ICD-10-CM

## 2023-09-21 DIAGNOSIS — M1990 Unspecified osteoarthritis, unspecified site: Secondary | ICD-10-CM

## 2023-09-21 DIAGNOSIS — M7918 Myalgia, other site: Secondary | ICD-10-CM

## 2023-09-21 DIAGNOSIS — K219 Gastro-esophageal reflux disease without esophagitis: Secondary | ICD-10-CM

## 2023-09-21 DIAGNOSIS — G9050 Complex regional pain syndrome I, unspecified: Secondary | ICD-10-CM

## 2023-09-21 DIAGNOSIS — C801 Malignant (primary) neoplasm, unspecified: Secondary | ICD-10-CM

## 2023-09-21 DIAGNOSIS — R51 Headache: Secondary | ICD-10-CM

## 2023-09-21 DIAGNOSIS — M25552 Pain in left hip: Secondary | ICD-10-CM

## 2023-09-21 DIAGNOSIS — M545 Low back pain: Secondary | ICD-10-CM

## 2023-09-21 NOTE — Progress Notes
chief complaint  Subjective        HPI:  Tina Landry is a 77 y.o. female who I was asked to see in consultation for evaluation of chronic sinusitis.   Hx of bilateral FESS, chronic pseudomonal infection, as well as a bilateral mega antrostomies.  Last seen a month ago with a left sided purulent drainage with cx positive for pseudomonas.  Has been on amikacin irrigations for the past 3 weeks and better.  Green drainage has gone away but still has some gray mucus.  No pain      The following portions of the patient's history were reviewed and updated as appropriate: allergies, current medications, past family history, past medical history, past social history, past surgical history and problem list.          ROS above reviewed      Current Outpatient Medications:     acetaminophen (TYLENOL) 325 mg tablet, Take one tablet by mouth every 4 hours as needed for Pain., Disp: 50 tablet, Rfl: 0    amikacin 150 mg capsule (COMPOUND), Open Compound amikacin sulfate 150mg  capsule and add contents to sinus rinse twice daily., Disp: 60 capsule, Rfl: 3    Aspirin 81 mg Tab, Take one tablet by mouth every morning.  , Disp: , Rfl:     lidocaine (LIDODERM) 5 % topical patch, 1 PATCH TOPICALLY EVERY 12 HOURS AS NEEDED FOR FOR PAIN, Disp: , Rfl:     MULTIVITAMIN PO, Take 1 Tab by mouth daily., Disp: , Rfl:     mupirocin (BACTROBAN) 2 % topical ointment, Add nickel size amount to rinse bottle and rinse through both nostrils BID, Disp: 30 g, Rfl: 3    mupirocin (BACTROBAN) 2 % topical ointment, Add nickel size amount to the rinse bottle BID and rinse through both nostrils, Disp: 30 g, Rfl: 3    PROPRANOLOL HCL (INDERAL PO), Take 60 mg by mouth every morning., Disp: , Rfl:     SUMAtriptan succinate (IMITREX) 50 mg tablet, Take  by mouth., Disp: , Rfl:     traMADoL (ULTRAM) 50 mg tablet, Take one tablet by mouth every 8 hours as needed for Pain., Disp: 12 tablet, Rfl: 0    trimethoprim (TRIMPEX) 100 mg tablet, Take one tablet by mouth at bedtime daily., Disp: , Rfl:     General    Vitals:    09/21/23 0902   BP: 126/76   Pulse: 66   Temp: 36.3 ?C (97.3 ?F)     General:  Well-developed, well-nourished  Communication and Voice:  Clear pitch and clarity, age appropriate    Head and Face  Inspection:  Normocephalic and atraumatic without masses or lesions  Palpation:  Facial skeleton intact without bony stepoffs, no sinus tenderness  Salivary Glands:  No masses or tenderness  Facial Strength:  Facial motility symmetric and full bilaterally             Left -  House-Brackman Grade 1/6             Right - House-Brackman Grade 1/6    ENT  External nose:  No scar or anatomic deformity  Internal Nose:  Septum intact and midline.  No edema, polyps, or rhinorrhea  Lips, Teeth, and gums:  Mucosa and teeth intact and viable  TMJ:  No pain to palpation with full mobility  Oral cavity/oropharynx:  No erythema or exudate with non-obstructive tonsils  Nasopharynx:  No masses or lesions with intact mucosa  Hypopharynx:  Intact mucosa without  pooling of secretions  Larynx:  Full true vocal cord mobility without lesions or masses    Neck  Neck and Trachea:  Midline trachea without mass or lesion  Thyroid:  No mass or nodularity  Lymphatics:  No lymphadenopathy    Respiratory  Respiratory effort:  Equal inspiration & expiration without use of accessory muscles. No stridor    Cardiovascular  Peripheral Vascular:  Warm extremities with equal pulses    Eyes  Nystagmus: None  EOM: Equal extraocular motion bilaterally    Neuro/Psych/Balance  Orientation: Patient oriented to person, place, and time  Affect: Appropriate mood and affect  Gait: Intact with no imbalance  Cranial nerves: II-XII are intact    Ear  External canal: Left - Canal is patent with intact skin                             Right - Canal is patent with intact skin  Tympanic Membranes: Left - Clear and mobile                                            Right - Clear and mobile  Middle Ears: Left - Aerated, no effusion, no masses                          Right - Aerated, no effusion, no masses      Office Procedure    Nasal Endoscopy    Indications: Was performed due to chronic rhinosinusitis    After topical anesthesia and decongestion had been obtained using aerosolized 1% lidocaine and oxymetazoline, a 30 degree rigid endoscope was placed into both nares with the patient in a sitting position. The following was observed:    Right Nasal Cavity and Paranasal Sinuses:     Interior of the nasal cavity: No masses or Purulence  Middle Meatus: no mass or purulence  Inferior turbinate: moderate hypertrophy  Middle Turbinate: no masses  Superior turbinate: no masses  Superior meatus: no masses  Sphenoethmoid recess: no purulence or polyps         Left Nasal Cavity and Paranasal Sinuses:     Interior of the nasal cavity: Widely patent and mucosa much improved in max; no gross purulence; minimal edema along floor of the sinus with scant amount of thickened mucus  Middle Meatus: no mass or purulence  Inferior turbinate: moderate hypertrophy  Middle Turbinate: no masses  Superior turbinate: no masses  Superior meatus: no masses  Sphenoethmoid recess: no purulence or polyps      Septum: midline  Other:    The patient tolerated the procedure well.                Impression/Plan:    Thank you for allowing me to see your patient in consultation.  Based on my exam and review of the patients records, the following is my impression and plan:    1.Chronic rhinosinusitis    2. Chronic rhinitis    - finish up amikacin irrigations and then go back to daily saline irrigations.  Reset expectations about having colored mucus from time to time.  Will use amilkacin when purulence comes      F/u:     Heloise Purpura, M.D.    Professor and Villa Herb, M.D. Chairman  Department  of Otolaryngology-Head and Neck Surgery  [facility]

## 2024-04-03 ENCOUNTER — Encounter: Admit: 2024-04-03 | Discharge: 2024-04-03
